# Patient Record
Sex: Male | Born: 1975
Health system: Southern US, Community
[De-identification: ages and names within clinical notes are randomized; demographics above are authoritative.]

## PROBLEM LIST (undated history)

## (undated) DIAGNOSIS — F32A Depression, unspecified: Secondary | ICD-10-CM

## (undated) DIAGNOSIS — F329 Major depressive disorder, single episode, unspecified: Secondary | ICD-10-CM

## (undated) DIAGNOSIS — R519 Headache, unspecified: Secondary | ICD-10-CM

## (undated) DIAGNOSIS — H409 Unspecified glaucoma: Secondary | ICD-10-CM

## (undated) DIAGNOSIS — K760 Fatty (change of) liver, not elsewhere classified: Secondary | ICD-10-CM

## (undated) DIAGNOSIS — K219 Gastro-esophageal reflux disease without esophagitis: Secondary | ICD-10-CM

## (undated) DIAGNOSIS — M1990 Unspecified osteoarthritis, unspecified site: Secondary | ICD-10-CM

## (undated) DIAGNOSIS — Z8489 Family history of other specified conditions: Secondary | ICD-10-CM

## (undated) DIAGNOSIS — F319 Bipolar disorder, unspecified: Secondary | ICD-10-CM

## (undated) DIAGNOSIS — I10 Essential (primary) hypertension: Secondary | ICD-10-CM

## (undated) DIAGNOSIS — J45909 Unspecified asthma, uncomplicated: Secondary | ICD-10-CM

## (undated) DIAGNOSIS — G4730 Sleep apnea, unspecified: Secondary | ICD-10-CM

## (undated) DIAGNOSIS — J449 Chronic obstructive pulmonary disease, unspecified: Secondary | ICD-10-CM

## (undated) DIAGNOSIS — R51 Headache: Secondary | ICD-10-CM

## (undated) DIAGNOSIS — E039 Hypothyroidism, unspecified: Secondary | ICD-10-CM

## (undated) DIAGNOSIS — F1020 Alcohol dependence, uncomplicated: Secondary | ICD-10-CM

## (undated) HISTORY — DX: Major depressive disorder, single episode, unspecified: F32.9

## (undated) HISTORY — DX: Unspecified glaucoma: H40.9

## (undated) HISTORY — PX: OTHER SURGICAL HISTORY: SHX169

## (undated) HISTORY — DX: Hypothyroidism, unspecified: E03.9

## (undated) HISTORY — PX: HERNIA REPAIR: SHX51

## (undated) HISTORY — DX: Bipolar disorder, unspecified: F31.9

## (undated) HISTORY — DX: Chronic obstructive pulmonary disease, unspecified: J44.9

## (undated) HISTORY — DX: Alcohol dependence, uncomplicated: F10.20

## (undated) HISTORY — DX: Unspecified asthma, uncomplicated: J45.909

## (undated) HISTORY — DX: Fatty (change of) liver, not elsewhere classified: K76.0

## (undated) HISTORY — DX: Essential (primary) hypertension: I10

## (undated) HISTORY — PX: VASECTOMY: SHX75

## (undated) HISTORY — DX: Sleep apnea, unspecified: G47.30

## (undated) HISTORY — DX: Unspecified osteoarthritis, unspecified site: M19.90

## (undated) HISTORY — DX: Depression, unspecified: F32.A

---

## 2004-10-18 HISTORY — PX: GALLBLADDER SURGERY: SHX652

## 2017-06-28 ENCOUNTER — Ambulatory Visit (INDEPENDENT_AMBULATORY_CARE_PROVIDER_SITE_OTHER): Payer: 59 | Admitting: Adult Health

## 2017-06-28 ENCOUNTER — Other Ambulatory Visit: Payer: Self-pay | Admitting: Adult Health

## 2017-06-28 ENCOUNTER — Encounter: Payer: Self-pay | Admitting: Adult Health

## 2017-06-28 VITALS — BP 140/88 | HR 104 | Temp 98.3°F | Resp 20 | Ht 69.0 in | Wt 267.0 lb

## 2017-06-28 DIAGNOSIS — I10 Essential (primary) hypertension: Secondary | ICD-10-CM

## 2017-06-28 DIAGNOSIS — F319 Bipolar disorder, unspecified: Secondary | ICD-10-CM | POA: Diagnosis not present

## 2017-06-28 DIAGNOSIS — E785 Hyperlipidemia, unspecified: Secondary | ICD-10-CM

## 2017-06-28 DIAGNOSIS — J449 Chronic obstructive pulmonary disease, unspecified: Secondary | ICD-10-CM | POA: Diagnosis not present

## 2017-06-28 DIAGNOSIS — Z760 Encounter for issue of repeat prescription: Secondary | ICD-10-CM | POA: Insufficient documentation

## 2017-06-28 DIAGNOSIS — H409 Unspecified glaucoma: Secondary | ICD-10-CM

## 2017-06-28 DIAGNOSIS — Z7689 Persons encountering health services in other specified circumstances: Secondary | ICD-10-CM

## 2017-06-28 DIAGNOSIS — Z23 Encounter for immunization: Secondary | ICD-10-CM | POA: Diagnosis not present

## 2017-06-28 MED ORDER — LISINOPRIL 10 MG PO TABS: 10.0000 mg | ORAL_TABLET | Freq: Every day | ORAL | 1 refills | Status: DC

## 2017-06-28 MED ORDER — COLESTIPOL HCL 1 G PO TABS: 1.0000 g | ORAL_TABLET | Freq: Two times a day (BID) | ORAL | 1 refills | Status: DC

## 2017-06-28 MED ORDER — PRAVASTATIN SODIUM 20 MG PO TABS: 20.0000 mg | ORAL_TABLET | Freq: Every day | ORAL | 1 refills | Status: DC

## 2017-06-28 MED ORDER — AMITRIPTYLINE HCL 50 MG PO TABS: 50.0000 mg | ORAL_TABLET | Freq: Every day | ORAL | 1 refills | Status: DC

## 2017-06-28 MED ORDER — RANITIDINE HCL 150 MG PO TABS: 150.0000 mg | ORAL_TABLET | Freq: Two times a day (BID) | ORAL | 1 refills | Status: DC

## 2017-06-28 MED ORDER — ALBUTEROL SULFATE HFA 108 (90 BASE) MCG/ACT IN AERS: 2.0000 | INHALATION_SPRAY | RESPIRATORY_TRACT | 3 refills | Status: DC | PRN

## 2017-06-28 MED ORDER — MONTELUKAST SODIUM 10 MG PO TABS: 10.0000 mg | ORAL_TABLET | Freq: Every day | ORAL | 1 refills | Status: DC

## 2017-06-28 MED ORDER — CETIRIZINE HCL 10 MG PO CHEW: 10.0000 mg | CHEWABLE_TABLET | Freq: Every day | ORAL | 1 refills | Status: DC

## 2017-06-28 MED ORDER — DIPHENHYDRAMINE HCL (SLEEP) 25 MG PO CAPS: 25.0000 mg | ORAL_CAPSULE | Freq: Every day | ORAL | 1 refills | Status: DC

## 2017-06-28 MED ORDER — MOMETASONE FUROATE 50 MCG/ACT NA SUSP: 2.0000 | Freq: Every day | NASAL | 6 refills | Status: DC

## 2017-06-28 MED ORDER — FLUTICASONE FUROATE-VILANTEROL 100-25 MCG/INH IN AEPB: 1.0000 | INHALATION_SPRAY | Freq: Every day | RESPIRATORY_TRACT | 3 refills | Status: DC

## 2017-06-28 MED FILL — LISINOPRIL 10 MG TABLET: 10 | 90 days supply | Qty: 90 | Fill #0

## 2017-06-28 MED FILL — BREO ELLIPTA 100-25 MCG INH: 100-25 | 30 days supply | Qty: 60 | Fill #0

## 2017-06-28 MED FILL — PRAVASTATIN SODIUM 20 MG TA: 20 | 90 days supply | Qty: 90 | Fill #0

## 2017-06-28 MED FILL — AMITRIPTYLINE HCL 50 MG TAB: 50 | 90 days supply | Qty: 90 | Fill #0

## 2017-06-28 MED FILL — VENTOLIN HFA 90 MCG INHALER: 108 (90 BAS | 17 days supply | Qty: 18 | Fill #0

## 2017-06-28 MED FILL — MOMETASONE FUROATE 50 MCG S: 50 | 30 days supply | Qty: 17 | Fill #0

## 2017-06-28 MED FILL — MONTELUKAST SOD 10 MG TAB: 10 | 90 days supply | Qty: 90 | Fill #0

## 2017-06-28 MED FILL — raNITIdine HCL 150 MG TABS: 150 | 90 days supply | Qty: 180 | Fill #0

## 2017-06-28 MED FILL — COLESTIPOL HCL 1 GM TABLET: 1 | 90 days supply | Qty: 180 | Fill #0

## 2017-06-28 NOTE — Progress Notes (Signed)
Patient presents to clinic today to establish care. He is a pleasant 41 year old male who  has a past medical history of Anemia; Arthritis; Asthma; Depression; Glaucoma; and Hypertension.   Recently moved to [place] from [place].   He had a physical in 2018   Acute Concerns: Establish Care   Chronic Issues: Glaucoma - Has been diagnosed 2-3 years ago. Needs referral to new eye doctor   Depression - Controlled with Latuda, lexapro and Ativan as needed. He feels as though his depression is well controlled  Sleep Apnea - he wears CPAP   COPD - He reports trying multiple inhalers in the past. He has been seen by pulmonary in the past, but his previous PCP was controlling in the past. He is currently using Albuteral and  Advair. He feels as though the Advair does not do much for him. He will walk up his apartment stairs and becomes short of breath  Hypertension - He takes lisinopril 10 mg   Hyperlipidemia - he takes pravastatin 20 mg  Health Maintenance: Dental -- Routine Dental  Vision -- Routine  Immunizations -- Needs flu vaccination  Colonoscopy -- Never had    Past Medical History:  Diagnosis Date  . Anemia   . Arthritis   . Asthma   . Depression   . Glaucoma   . Hypertension     Past Surgical History:  Procedure Laterality Date  . GALLBLADDER SURGERY  2006    No current outpatient prescriptions on file prior to visit.   No current facility-administered medications on file prior to visit.     Not on File  Family History  Problem Relation Age of Onset  . Arthritis Mother   . Alcohol abuse Father   . Arthritis Father   . Hyperlipidemia Father   . Hypertension Father   . Alcohol abuse Maternal Grandfather   . Alcohol abuse Paternal Grandfather   . Arthritis Paternal Grandfather   . Hyperlipidemia Paternal Grandfather   . Heart disease Paternal Grandfather   . Hypertension Paternal Grandfather   . Diabetes Other     Social History    Social History  . Marital status: Married    Spouse name: N/A  . Number of children: N/A  . Years of education: N/A   Occupational History  . Not on file.   Social History Main Topics  . Smoking status: Never Smoker  . Smokeless tobacco: Never Used  . Alcohol use No  . Drug use: No  . Sexual activity: Yes   Other Topics Concern  . Not on file   Social History Narrative  . No narrative on file    Review of Systems  Constitutional: Negative.   HENT: Negative.   Eyes: Negative.   Respiratory: Negative.   Cardiovascular: Negative.   Gastrointestinal: Negative.   Genitourinary: Negative.   Musculoskeletal: Negative.   Skin: Negative.   Neurological: Negative.   Endo/Heme/Allergies: Negative.   Psychiatric/Behavioral: Negative.   All other systems reviewed and are negative.   BP 140/88   Pulse (!) 104   Temp 98.3 F (36.8 C)   Resp 20   Ht 5\' 9"  (1.753 m)   Wt 267 lb (121.1 kg)   SpO2 97%   BMI 39.43 kg/m   Physical Exam  Constitutional: He is oriented to person, place, and time and well-developed, well-nourished, and in no distress. No distress.  Obese   HENT:  Head: Normocephalic and atraumatic.  Right Ear: External ear  normal.  Left Ear: External ear normal.  Nose: Nose normal.  Mouth/Throat: Oropharynx is clear and moist. No oropharyngeal exudate.  Eyes: Pupils are equal, round, and reactive to light. Conjunctivae and EOM are normal. Right eye exhibits no discharge. Left eye exhibits no discharge. No scleral icterus.  Neck: Normal range of motion. Neck supple. No JVD present. No tracheal deviation present. No thyromegaly present.  Cardiovascular: Normal rate, regular rhythm, normal heart sounds and intact distal pulses.  Exam reveals no gallop and no friction rub.   No murmur heard. Pulmonary/Chest: Effort normal and breath sounds normal. No stridor. No respiratory distress. He has no wheezes. He has no rales. He exhibits no tenderness.   Musculoskeletal: Normal range of motion. He exhibits edema (trace non pitting in left ankle ). He exhibits no tenderness or deformity.  Walks with a limp s/p ankle surgery   Lymphadenopathy:    He has no cervical adenopathy.  Neurological: He is alert and oriented to person, place, and time. He displays normal reflexes. No cranial nerve deficit. He exhibits normal muscle tone. Coordination normal. GCS score is 15.  Skin: Skin is warm and dry. No rash noted. He is not diaphoretic. No erythema. No pallor.  Psychiatric: Mood, memory, affect and judgment normal.  Nursing note and vitals reviewed.  Assessment/Plan: 1. Encounter to establish care - Follow up for CPE  - Will request notes from previous PCP  - Educated on the importance of diet and exercise   2. Essential hypertension - lisinopril (PRINIVIL,ZESTRIL) 10 MG tablet; Take 1 tablet (10 mg total) by mouth daily.  Dispense: 90 tablet; Refill: 1  3. Glaucoma of both eyes, unspecified glaucoma type - List of local opthalmology given   4. Hyperlipidemia, unspecified hyperlipidemia type  - colestipol (COLESTID) 1 g tablet; Take 1 tablet (1 g total) by mouth 2 (two) times daily.  Dispense: 90 tablet; Refill: 1 - pravastatin (PRAVACHOL) 20 MG tablet; Take 1 tablet (20 mg total) by mouth at bedtime.  Dispense: 90 tablet; Refill: 1  5. Medication refill  - fluticasone furoate-vilanterol (BREO ELLIPTA) 100-25 MCG/INH AEPB; Inhale 1 puff into the lungs daily.  Dispense: 28 each; Refill: 3 - albuterol (PROVENTIL HFA;VENTOLIN HFA) 108 (90 Base) MCG/ACT inhaler; Inhale 2 puffs into the lungs every 4 (four) hours as needed for wheezing or shortness of breath.  Dispense: 18 g; Refill: 3 - amitriptyline (ELAVIL) 50 MG tablet; Take 1 tablet (50 mg total) by mouth at bedtime.  Dispense: 90 tablet; Refill: 1 - cetirizine (ZYRTEC) 10 MG chewable tablet; Chew 1 tablet (10 mg total) by mouth daily.  Dispense: 90 tablet; Refill: 1 - colestipol  (COLESTID) 1 g tablet; Take 1 tablet (1 g total) by mouth 2 (two) times daily.  Dispense: 90 tablet; Refill: 1 - DiphenhydrAMINE HCl, Sleep, 25 MG CAPS; Take 25 mg by mouth at bedtime.  Dispense: 90 capsule; Refill: 1 - lisinopril (PRINIVIL,ZESTRIL) 10 MG tablet; Take 1 tablet (10 mg total) by mouth daily.  Dispense: 90 tablet; Refill: 1 - mometasone (NASONEX) 50 MCG/ACT nasal spray; Place 2 sprays into the nose daily.  Dispense: 17 g; Refill: 6 - montelukast (SINGULAIR) 10 MG tablet; Take 1 tablet (10 mg total) by mouth at bedtime.  Dispense: 90 tablet; Refill: 1 - pravastatin (PRAVACHOL) 20 MG tablet; Take 1 tablet (20 mg total) by mouth at bedtime.  Dispense: 90 tablet; Refill: 1 - ranitidine (ZANTAC) 150 MG tablet; Take 1 tablet (150 mg total) by mouth 2 (two) times daily.  Dispense: 90 tablet; Refill: 1  6. Chronic obstructive pulmonary disease, unspecified COPD type (HCC) - Will trial on Breo. D/c Advair.  - Consider referral to pulmonary  - fluticasone furoate-vilanterol (BREO ELLIPTA) 100-25 MCG/INH AEPB; Inhale 1 puff into the lungs daily.  Dispense: 28 each; Refill: 3 - albuterol (PROVENTIL HFA;VENTOLIN HFA) 108 (90 Base) MCG/ACT inhaler; Inhale 2 puffs into the lungs every 4 (four) hours as needed for wheezing or shortness of breath.  Dispense: 18 g; Refill: 3  7. Bipolar 1 disorder, depressed (HCC) - list of local psychiatrists given    Shirline Frees, NP

## 2017-06-28 NOTE — Telephone Encounter (Signed)
Pharmacy called to ask about pt's 2 scripts:  ranitidine (ZANTAC) 150 MG tablet  colestipol (COLESTID) 1 g tablet  These 2 were only done for 45 days. Thinks this may be an error. All the rest were done for 90 days.  Please call Lanora Manislizabeth @ [phone] She states these 2 can be edited and no need to send in new scripts.   [facility] - [place], [place] - [address]

## 2017-06-28 NOTE — Patient Instructions (Signed)
It was great meeting you today   Please follow up for your physical   I have sent in your medications    [facility] Address: [address], [place], [place] [address] Phone: [phone]  [facility] Address: [address], [place], [place] [address] Phone: [phone]

## 2017-06-28 NOTE — Addendum Note (Signed)
Addended by: Luella CookMCINTYRE, Longino Trefz E on: 06/28/2017 01:22 PM   Modules accepted: Orders

## 2017-06-29 MED ORDER — RANITIDINE HCL 150 MG PO TABS: 150.0000 mg | ORAL_TABLET | Freq: Two times a day (BID) | ORAL | 1 refills | Status: DC

## 2017-06-29 MED ORDER — COLESTIPOL HCL 1 G PO TABS: 1.0000 g | ORAL_TABLET | Freq: Two times a day (BID) | ORAL | 1 refills | Status: AC

## 2017-06-29 NOTE — Telephone Encounter (Signed)
Prescriptions re-sent for 90 day supply.

## 2017-07-08 ENCOUNTER — Encounter: Payer: Self-pay | Admitting: Adult Health

## 2017-07-22 ENCOUNTER — Encounter: Payer: Self-pay | Admitting: Adult Health

## 2017-08-09 MED FILL — BREO ELLIPTA 100-25 MCG INH: 100-25 | 30 days supply | Qty: 60 | Fill #1

## 2017-08-12 ENCOUNTER — Ambulatory Visit (INDEPENDENT_AMBULATORY_CARE_PROVIDER_SITE_OTHER): Payer: 59 | Admitting: Adult Health

## 2017-08-12 ENCOUNTER — Encounter: Payer: Self-pay | Admitting: Adult Health

## 2017-08-12 VITALS — BP 120/92 | Temp 97.7°F | Ht 69.0 in | Wt 270.0 lb

## 2017-08-12 DIAGNOSIS — K760 Fatty (change of) liver, not elsewhere classified: Secondary | ICD-10-CM | POA: Diagnosis not present

## 2017-08-12 DIAGNOSIS — E785 Hyperlipidemia, unspecified: Secondary | ICD-10-CM

## 2017-08-12 DIAGNOSIS — E039 Hypothyroidism, unspecified: Secondary | ICD-10-CM | POA: Diagnosis not present

## 2017-08-12 DIAGNOSIS — M25561 Pain in right knee: Secondary | ICD-10-CM

## 2017-08-12 DIAGNOSIS — M25562 Pain in left knee: Secondary | ICD-10-CM

## 2017-08-12 DIAGNOSIS — F319 Bipolar disorder, unspecified: Secondary | ICD-10-CM

## 2017-08-12 DIAGNOSIS — J449 Chronic obstructive pulmonary disease, unspecified: Secondary | ICD-10-CM

## 2017-08-12 DIAGNOSIS — G8929 Other chronic pain: Secondary | ICD-10-CM

## 2017-08-12 DIAGNOSIS — I10 Essential (primary) hypertension: Secondary | ICD-10-CM | POA: Diagnosis not present

## 2017-08-12 DIAGNOSIS — Z0000 Encounter for general adult medical examination without abnormal findings: Secondary | ICD-10-CM

## 2017-08-12 LAB — CBC WITH DIFFERENTIAL/PLATELET
BASOS ABS: 0.1 10*3/uL (ref 0.0–0.1)
BASOS PCT: 0.9 % (ref 0.0–3.0)
EOS ABS: 0.1 10*3/uL (ref 0.0–0.7)
Eosinophils Relative: 0.7 % (ref 0.0–5.0)
HCT: 45 % (ref 39.0–52.0)
Hemoglobin: 15.4 g/dL (ref 13.0–17.0)
LYMPHS ABS: 1.9 10*3/uL (ref 0.7–4.0)
LYMPHS PCT: 22.1 % (ref 12.0–46.0)
MCHC: 34.1 g/dL (ref 30.0–36.0)
MCV: 87.1 fl (ref 78.0–100.0)
MONO ABS: 0.6 10*3/uL (ref 0.1–1.0)
Monocytes Relative: 7.7 % (ref 3.0–12.0)
NEUTROS ABS: 5.8 10*3/uL (ref 1.4–7.7)
NEUTROS PCT: 68.6 % (ref 43.0–77.0)
PLATELETS: 278 10*3/uL (ref 150.0–400.0)
RBC: 5.16 Mil/uL (ref 4.22–5.81)
RDW: 13.3 % (ref 11.5–15.5)
WBC: 8.4 10*3/uL (ref 4.0–10.5)

## 2017-08-12 LAB — TSH: TSH: 3.26 u[IU]/mL (ref 0.35–4.50)

## 2017-08-12 LAB — LIPID PANEL
CHOL/HDL RATIO: 4
CHOLESTEROL: 206 mg/dL — AB (ref 0–200)
HDL: 51 mg/dL (ref >39.00)
LDL CALC: 125 mg/dL — AB (ref 0–99)
NonHDL: 155.07
TRIGLYCERIDES: 151 mg/dL — AB (ref 0.0–149.0)
VLDL: 30.2 mg/dL (ref 0.0–40.0)

## 2017-08-12 LAB — HEPATIC FUNCTION PANEL
ALK PHOS: 103 U/L (ref 39–117)
ALT: 104 U/L — ABNORMAL HIGH (ref 0–53)
AST: 58 U/L — ABNORMAL HIGH (ref 0–37)
Albumin: 4.6 g/dL (ref 3.5–5.2)
BILIRUBIN DIRECT: 0.1 mg/dL (ref 0.0–0.3)
BILIRUBIN TOTAL: 0.6 mg/dL (ref 0.2–1.2)
TOTAL PROTEIN: 7.4 g/dL (ref 6.0–8.3)

## 2017-08-12 LAB — BASIC METABOLIC PANEL
BUN: 9 mg/dL (ref 6–23)
CALCIUM: 9.7 mg/dL (ref 8.4–10.5)
CHLORIDE: 101 meq/L (ref 96–112)
CO2: 23 meq/L (ref 19–32)
CREATININE: 0.91 mg/dL (ref 0.40–1.50)
GFR: 97.54 mL/min (ref >60.00)
Glucose, Bld: 122 mg/dL — ABNORMAL HIGH (ref 70–99)
Potassium: 3.9 mEq/L (ref 3.5–5.1)
Sodium: 135 mEq/L (ref 135–145)

## 2017-08-12 LAB — T4, FREE: FREE T4: 0.69 ng/dL (ref 0.60–1.60)

## 2017-08-12 LAB — HEMOGLOBIN A1C: Hgb A1c MFr Bld: 6 % (ref 4.6–6.5)

## 2017-08-12 LAB — T3, FREE: T3, Free: 3.7 pg/mL (ref 2.3–4.2)

## 2017-08-12 MED ORDER — LURASIDONE HCL 80 MG PO TABS: 80.0000 mg | ORAL_TABLET | Freq: Every evening | ORAL | 0 refills | Status: DC

## 2017-08-12 MED ORDER — ESCITALOPRAM OXALATE 10 MG PO TABS: 15.0000 mg | ORAL_TABLET | ORAL | 0 refills | Status: DC

## 2017-08-12 MED FILL — ESCITALOPRAM 10 MG TABLET: 10 | 30 days supply | Qty: 45 | Fill #0

## 2017-08-12 NOTE — Patient Instructions (Signed)
It was great seeing you today   I will follow up with you regarding your blood work   Someone will contact you regarding your appointment with orthopedics.   Please let me know if you need anything   Health Maintenance, Male A healthy lifestyle and preventative care can promote health and wellness.  Maintain regular health, dental, and eye exams.  Eat a healthy diet. Foods like vegetables, fruits, whole grains, low-fat dairy products, and lean protein foods contain the nutrients you need and are low in calories. Decrease your intake of foods high in solid fats, added sugars, and salt. Get information about a proper diet from your health care provider, if necessary.  Regular physical exercise is one of the most important things you can do for your health. Most adults should get at least 150 minutes of moderate-intensity exercise (any activity that increases your heart rate and causes you to sweat) each week. In addition, most adults need muscle-strengthening exercises on 2 or more days a week.   Maintain a healthy weight. The body mass index (BMI) is a screening tool to identify possible weight problems. It provides an estimate of body fat based on height and weight. Your health care provider can find your BMI and can help you achieve or maintain a healthy weight. For males 20 years and older:  A BMI below 18.5 is considered underweight.  A BMI of 18.5 to 24.9 is normal.  A BMI of 25 to 29.9 is considered overweight.  A BMI of 30 and above is considered obese.  Maintain normal blood lipids and cholesterol by exercising and minimizing your intake of saturated fat. Eat a balanced diet with plenty of fruits and vegetables. Blood tests for lipids and cholesterol should begin at age 41 and be repeated every 5 years. If your lipid or cholesterol levels are high, you are over age 41, or you are at high risk for heart disease, you may need your cholesterol levels checked more frequently.Ongoing  high lipid and cholesterol levels should be treated with medicines if diet and exercise are not working.  If you smoke, find out from your health care provider how to quit. If you do not use tobacco, do not start.  Lung cancer screening is recommended for adults aged 55-80 years who are at high risk for developing lung cancer because of a history of smoking. A yearly low-dose CT scan of the lungs is recommended for people who have at least a 30-pack-year history of smoking and are current smokers or have quit within the past 15 years. A pack year of smoking is smoking an average of 1 pack of cigarettes a day for 1 year (for example, a 30-pack-year history of smoking could mean smoking 1 pack a day for 30 years or 2 packs a day for 15 years). Yearly screening should continue until the smoker has stopped smoking for at least 15 years. Yearly screening should be stopped for people who develop a health problem that would prevent them from having lung cancer treatment.  If you choose to drink alcohol, do not have more than 2 drinks per day. One drink is considered to be 12 oz (360 mL) of beer, 5 oz (150 mL) of wine, or 1.5 oz (45 mL) of liquor.  Avoid the use of street drugs. Do not share needles with anyone. Ask for help if you need support or instructions about stopping the use of drugs.  High blood pressure causes heart disease and increases the risk  of stroke. High blood pressure is more likely to develop in:  People who have blood pressure in the end of the normal range (100-139/85-89 mm Hg).  People who are overweight or obese.  People who are African American.  If you are 36-50 years of age, have your blood pressure checked every 3-5 years. If you are 55 years of age or older, have your blood pressure checked every year. You should have your blood pressure measured twice--once when you are at a hospital or clinic, and once when you are not at a hospital or clinic. Record the average of the two  measurements. To check your blood pressure when you are not at a hospital or clinic, you can use:  An automated blood pressure machine at a pharmacy.  A home blood pressure monitor.  If you are 77-12 years old, ask your health care provider if you should take aspirin to prevent heart disease.  Diabetes screening involves taking a blood sample to check your fasting blood sugar level. This should be done once every 3 years after age 66 if you are at a normal weight and without risk factors for diabetes. Testing should be considered at a younger age or be carried out more frequently if you are overweight and have at least 1 risk factor for diabetes.  Colorectal cancer can be detected and often prevented. Most routine colorectal cancer screening begins at the age of 64 and continues through age 32. However, your health care provider may recommend screening at an earlier age if you have risk factors for colon cancer. On a yearly basis, your health care provider may provide home test kits to check for hidden blood in the stool. A small camera at the end of a tube may be used to directly examine the colon (sigmoidoscopy or colonoscopy) to detect the earliest forms of colorectal cancer. Talk to your health care provider about this at age 15 when routine screening begins. A direct exam of the colon should be repeated every 5-10 years through age 8, unless early forms of precancerous polyps or small growths are found.  People who are at an increased risk for hepatitis B should be screened for this virus. You are considered at high risk for hepatitis B if:  You were born in a country where hepatitis B occurs often. Talk with your health care provider about which countries are considered high risk.  Your parents were born in a high-risk country and you have not received a shot to protect against hepatitis B (hepatitis B vaccine).  You have HIV or AIDS.  You use needles to inject street drugs.  You live  with, or have sex with, someone who has hepatitis B.  You are a man who has sex with other men (MSM).  You get hemodialysis treatment.  You take certain medicines for conditions like cancer, organ transplantation, and autoimmune conditions.  Hepatitis C blood testing is recommended for all people born from [birth year] through 1965 and any individual with known risk factors for hepatitis C.  Healthy men should no longer receive prostate-specific antigen (PSA) blood tests as part of routine cancer screening. Talk to your health care provider about prostate cancer screening.  Testicular cancer screening is not recommended for adolescents or adult males who have no symptoms. Screening includes self-exam, a health care provider exam, and other screening tests. Consult with your health care provider about any symptoms you have or any concerns you have about testicular cancer.  Practice safe sex.  Use condoms and avoid high-risk sexual practices to reduce the spread of sexually transmitted infections (STIs).  You should be screened for STIs, including gonorrhea and chlamydia if:  You are sexually active and are younger than 24 years.  You are older than 24 years, and your health care provider tells you that you are at risk for this type of infection.  Your sexual activity has changed since you were last screened, and you are at an increased risk for chlamydia or gonorrhea. Ask your health care provider if you are at risk.  If you are at risk of being infected with HIV, it is recommended that you take a prescription medicine daily to prevent HIV infection. This is called pre-exposure prophylaxis (PrEP). You are considered at risk if:  You are a man who has sex with other men (MSM).  You are a heterosexual man who is sexually active with multiple partners.  You take drugs by injection.  You are sexually active with a partner who has HIV.  Talk with your health care provider about whether you are at  high risk of being infected with HIV. If you choose to begin PrEP, you should first be tested for HIV. You should then be tested every 3 months for as long as you are taking PrEP.  Use sunscreen. Apply sunscreen liberally and repeatedly throughout the day. You should seek shade when your shadow is shorter than you. Protect yourself by wearing long sleeves, pants, a wide-brimmed hat, and sunglasses year round whenever you are outdoors.  Tell your health care provider of new moles or changes in moles, especially if there is a change in shape or color. Also, tell your health care provider if a mole is larger than the size of a pencil eraser.  A one-time screening for abdominal aortic aneurysm (AAA) and surgical repair of large AAAs by ultrasound is recommended for men aged 65-75 years who are current or former smokers.  Stay current with your vaccines (immunizations).   This information is not intended to replace advice given to you by your health care provider. Make sure you discuss any questions you have with your health care provider.   Document Released: 04/01/2008 Document Revised: 10/25/2014 Document Reviewed: 03/01/2011 Elsevier Interactive Patient Education [employer]

## 2017-08-12 NOTE — Progress Notes (Signed)
Subjective:    Patient ID: Colton PizzaJeffrey A Soderman Jr., male    DOB: 19-Jul-1976, 41 y.o.   MRN: [id number]  HPI  Patient presents for yearly preventative medicine examination. He is a pleasant 41 year old male who  has a past medical history of Alcoholism (HCC); Arthritis; Asthma; Bipolar 1 disorder (HCC); COPD (chronic obstructive pulmonary disease) (HCC); Depression; Glaucoma; Hypertension; Major depressive disorder; and Sleep apnea.  He is taking Latuda, Lexapro, and Ativan PRN for psychiatric issues. He reports that he has an appointment with psychiatry in one month. He needs refills of Lexapro and Latuda   He takes lisinopril 10 mg for hypertension.   He takes Colestid for hyperlipidemia   [facility]. He reports that " I haven't breathed this well in years."  Today in the clinic he mentions that he has been on synthroid in the past for hypothyroidism but has since run out of medication. He is not sure of his dose.   He also reports that prior to moving he was diagnosed with fatty liver disease and was set up to see a GI specialist.    All immunizations and health maintenance protocols were reviewed with the patient and needed orders were placed.  Appropriate screening laboratory values were ordered for the patient including screening of hyperlipidemia, renal function and hepatic function. If indicated by BPH, a PSA was ordered.  Medication reconciliation,  past medical history, social history, problem list and allergies were reviewed in detail with the patient  Goals were established with regard to weight loss, exercise, and  diet in compliance with medications  Review of Systems  Constitutional: Negative.   HENT: Negative.   Eyes: Negative.   Respiratory: Positive for shortness of breath.   Cardiovascular: Negative.   Gastrointestinal: Negative.   Endocrine: Negative.   Genitourinary: Negative.   Musculoskeletal: Positive for  arthralgias, back pain and gait problem.  Skin: Negative.   Allergic/Immunologic: Negative.   Hematological: Negative.   Psychiatric/Behavioral: Positive for behavioral problems and sleep disturbance. Negative for suicidal ideas. The patient is nervous/anxious.   All other systems reviewed and are negative.  Past Medical History:  Diagnosis Date  . Alcoholism (HCC)   . Arthritis   . Asthma   . Bipolar 1 disorder (HCC)   . COPD (chronic obstructive pulmonary disease) (HCC)   . Depression   . Fatty liver   . Glaucoma   . Hypertension   . Hypothyroidism   . Major depressive disorder   . Sleep apnea     Social History   Social History  . Marital status: Married    Spouse name: N/A  . Number of children: N/A  . Years of education: N/A   Occupational History  . Not on file.   Social History Main Topics  . Smoking status: Never Smoker  . Smokeless tobacco: Never Used  . Alcohol use No  . Drug use: No  . Sexual activity: Yes   Other Topics Concern  . Not on file   Social History Narrative   He is on disability    Two adopted children and one biological     Past Surgical History:  Procedure Laterality Date  . Cervical Disk replacement     . GALLBLADDER SURGERY  2006  . HERNIA REPAIR    . left ankle surgery     . VASECTOMY      Family History  Problem Relation Age of Onset  .  Arthritis Mother   . Alcohol abuse Father   . Arthritis Father   . Hyperlipidemia Father   . Hypertension Father   . Glaucoma Father   . Alcohol abuse Maternal Grandfather   . Alcohol abuse Paternal Grandfather   . Arthritis Paternal Grandfather   . Hyperlipidemia Paternal Grandfather   . Heart disease Paternal Grandfather   . Hypertension Paternal Grandfather   . Diabetes Other     Not on File  Current Outpatient Prescriptions on File Prior to Visit  Medication Sig Dispense Refill  . albuterol (PROVENTIL HFA;VENTOLIN HFA) 108 (90 Base) MCG/ACT inhaler Inhale 2 puffs into the  lungs every 4 (four) hours as needed for wheezing or shortness of breath. 18 g 3  . amitriptyline (ELAVIL) 50 MG tablet Take 1 tablet (50 mg total) by mouth at bedtime. 90 tablet 1  . cetirizine (ZYRTEC) 10 MG chewable tablet Chew 1 tablet (10 mg total) by mouth daily. 90 tablet 1  . colestipol (COLESTID) 1 g tablet Take 1 tablet (1 g total) by mouth 2 (two) times daily. 180 tablet 1  . DiphenhydrAMINE HCl, Sleep, 25 MG CAPS Take 25 mg by mouth at bedtime. 90 capsule 1  . fluticasone furoate-vilanterol (BREO ELLIPTA) 100-25 MCG/INH AEPB Inhale 1 puff into the lungs daily. 28 each 3  . lisinopril (PRINIVIL,ZESTRIL) 10 MG tablet Take 1 tablet (10 mg total) by mouth daily. 90 tablet 1  . LORazepam (ATIVAN) 1 MG tablet Take 1 mg by mouth every 4 (four) hours as needed for anxiety.    . mometasone (NASONEX) 50 MCG/ACT nasal spray Place 2 sprays into the nose daily. 17 g 6  . montelukast (SINGULAIR) 10 MG tablet Take 1 tablet (10 mg total) by mouth at bedtime. 90 tablet 1  . pravastatin (PRAVACHOL) 20 MG tablet Take 1 tablet (20 mg total) by mouth at bedtime. 90 tablet 1  . ranitidine (ZANTAC) 150 MG tablet Take 1 tablet (150 mg total) by mouth 2 (two) times daily. 180 tablet 1   No current facility-administered medications on file prior to visit.     BP (!) 120/92 (BP Location: Left Arm)   Temp 97.7 F (36.5 C) (Oral)   Ht 5\' 9"  (1.753 m)   Wt 270 lb (122.5 kg)   BMI 39.87 kg/m       Objective:   Physical Exam  Constitutional: He is oriented to person, place, and time. He appears well-developed and well-nourished. No distress.  HENT:  Head: Normocephalic and atraumatic.  Right Ear: External ear normal.  Left Ear: External ear normal.  Nose: Nose normal.  Mouth/Throat: Oropharynx is clear and moist. No oropharyngeal exudate.  Eyes: Pupils are equal, round, and reactive to light. Conjunctivae and EOM are normal. Right eye exhibits no discharge. Left eye exhibits no discharge. No scleral  icterus.  Neck: Neck supple. No JVD present. No tracheal deviation present. No thyromegaly present.  Cardiovascular: Normal rate, regular rhythm, normal heart sounds and intact distal pulses.  Exam reveals no gallop and no friction rub.   No murmur heard. Pulmonary/Chest: Effort normal and breath sounds normal. No stridor. No respiratory distress. He has no wheezes. He has no rales. He exhibits no tenderness.  Abdominal: Soft. Bowel sounds are normal. He exhibits no distension and no mass. There is no tenderness. There is no rebound and no guarding.  Genitourinary:  Genitourinary Comments: Refused    Musculoskeletal: Normal range of motion. He exhibits tenderness (bilateral knee pain ).  Walks with a  cain    Lymphadenopathy:    He has no cervical adenopathy.  Neurological: He is alert and oriented to person, place, and time. He has normal reflexes. He displays normal reflexes. No cranial nerve deficit. Coordination normal.  Skin: Skin is warm and dry. No rash noted. He is not diaphoretic. No erythema. No pallor.  Psychiatric: He has a normal mood and affect. Judgment and thought content normal.  Nursing note and vitals reviewed.     Assessment & Plan:  1. Routine general medical examination at a health care facility - Encouraged a heart healthy diet and frequent exercise  - Follow up in one year or sooner if needed - Basic metabolic panel - CBC with Differential/Platelet - Hepatic function panel - Hemoglobin A1c - Lipid panel - TSH - T4, Free - T3, free  2. Chronic pain of both knees - AMB referral to orthopedics  3. Essential hypertension - Well controlled.  - No change in medication at this time  - Basic metabolic panel - CBC with Differential/Platelet - Hepatic function panel - Hemoglobin A1c - Lipid panel  4. Chronic obstructive pulmonary disease, unspecified COPD type (HCC) - Better controlled with Breo   5. Bipolar 1 disorder, depressed (HCC)  - lurasidone  (LATUDA) 80 MG TABS tablet; Take 1 tablet (80 mg total) by mouth every evening.  Dispense: 30 tablet; Refill: 0 - escitalopram (LEXAPRO) 10 MG tablet; Take 1.5 tablets (15 mg total) by mouth every morning. TAKE 1 & 1/2 TABLETS EACH MORNING  Dispense: 45 tablet; Refill: 0  6. Hyperlipidemia, unspecified hyperlipidemia type - Continue with medication  - Encouraged heart healthy diet  - Basic metabolic panel - CBC with Differential/Platelet - Hepatic function panel - Hemoglobin A1c - Lipid panel - TSH  7. Hypothyroidism, unspecified type - Will find out what dose of Synthroid he takes when I call about labs   - TSH - T4, Free - T3, free  8. Fatty liver disease, nonalcoholic - Consider GI referral  - Hepatic function panel - Hemoglobin A1c - Lipid panel   Shirline Frees, NP

## 2017-08-16 ENCOUNTER — Ambulatory Visit (INDEPENDENT_AMBULATORY_CARE_PROVIDER_SITE_OTHER): Payer: 59 | Admitting: Orthopaedic Surgery

## 2017-08-16 ENCOUNTER — Encounter (INDEPENDENT_AMBULATORY_CARE_PROVIDER_SITE_OTHER): Payer: Self-pay | Admitting: Orthopaedic Surgery

## 2017-08-16 ENCOUNTER — Ambulatory Visit (INDEPENDENT_AMBULATORY_CARE_PROVIDER_SITE_OTHER): Payer: 59

## 2017-08-16 DIAGNOSIS — G8929 Other chronic pain: Secondary | ICD-10-CM

## 2017-08-16 DIAGNOSIS — M25561 Pain in right knee: Secondary | ICD-10-CM

## 2017-08-16 DIAGNOSIS — M25562 Pain in left knee: Secondary | ICD-10-CM | POA: Diagnosis not present

## 2017-08-16 MED ORDER — DICLOFENAC SODIUM 1 % TD GEL: 2.0000 g | Freq: Four times a day (QID) | TRANSDERMAL | 5 refills | Status: DC

## 2017-08-16 MED FILL — DICLOFENAC SODIUM 1% GEL: 1 | 13 days supply | Qty: 100 | Fill #0

## 2017-08-16 NOTE — Progress Notes (Signed)
Office Visit Note   Patient: Colton Vang.           Date of Birth: 12-20-1975           MRN: [id number] Visit Date: 08/16/2017              Requested by: Shirline Frees, NP [address] [place], [place] [address] PCP: Shirline Frees, NP   Assessment & Plan: Visit Diagnoses:  1. Chronic pain of both knees     Plan: Impression is bilateral knee arthritis.  Patient has failed conservative treatment.  Recommend MRI to rule out structural abnormalities.  Follow-up after the MRI.  Prescription for Voltaren gel.  Follow-Up Instructions: Return in about 2 weeks (around 08/30/2017).   Orders:  Orders Placed This Encounter  Procedures  . XR Knee 1-2 Views Right  . XR Knee 1-2 Views Left  . MR Knee Left w/o contrast  . MR Knee Right w/o contrast   Meds ordered this encounter  Medications  . diclofenac sodium (VOLTAREN) 1 % GEL    Sig: Apply 2 g topically 4 (four) times daily.    Dispense:  1 Tube    Refill:  5      Procedures: No procedures performed   Clinical Data: No additional findings.   Subjective: Chief Complaint  Patient presents with  . Right Knee - Pain  . Left Knee - Pain    Patient is a 41 year old gentleman who has had knee pain worse in the right and he ambulates with a cane due to this.  He has had Supartz injections in the past with initial relief but this no longer provides him with any relief.  The pain is worse with prolonged standing and walking and with climbing stairs.  Denies any numbness and tingling.  He is on disability for bipolar and has a past history of alcoholism.    Review of Systems  Constitutional: Negative.   All other systems reviewed and are negative.    Objective: Vital Signs: There were no vitals taken for this visit.  Physical Exam  Constitutional: He is oriented to person, place, and time. He appears well-developed and well-nourished.  HENT:  Head: Normocephalic and atraumatic.  Eyes: Pupils are equal,  round, and reactive to light.  Neck: Neck supple.  Pulmonary/Chest: Effort normal.  Abdominal: Soft.  Musculoskeletal: Normal range of motion.  Neurological: He is alert and oriented to person, place, and time.  Skin: Skin is warm.  Psychiatric: He has a normal mood and affect. His behavior is normal. Judgment and thought content normal.  Nursing note and vitals reviewed.   Ortho Exam Bilateral knee exam shows no joint effusion.  Collaterals and cruciates are stable.  Mild joint line tenderness.  Significant patellar crepitus.  Specialty Comments:  No specialty comments available.  Imaging: Xr Knee 1-2 Views Left  Result Date: 08/16/2017 Mild osteoarthritis  Xr Knee 1-2 Views Right  Result Date: 08/16/2017 Mild osteoarthritis    PMFS History: Patient Active Problem List   Diagnosis Date Noted  . Hypothyroidism 08/12/2017  . Fatty liver disease, nonalcoholic 08/12/2017  . Essential hypertension 06/28/2017  . Glaucoma of both eyes 06/28/2017  . Hyperlipidemia 06/28/2017  . Medication refill 06/28/2017  . Chronic obstructive pulmonary disease (HCC) 06/28/2017  . Bipolar 1 disorder, depressed (HCC) 06/28/2017   Past Medical History:  Diagnosis Date  . Alcoholism (HCC)   . Arthritis   . Asthma   . Bipolar 1 disorder (HCC)   .  COPD (chronic obstructive pulmonary disease) (HCC)   . Depression   . Fatty liver   . Glaucoma   . Hypertension   . Hypothyroidism   . Major depressive disorder   . Sleep apnea     Family History  Problem Relation Age of Onset  . Arthritis Mother   . Alcohol abuse Father   . Arthritis Father   . Hyperlipidemia Father   . Hypertension Father   . Glaucoma Father   . Alcohol abuse Maternal Grandfather   . Alcohol abuse Paternal Grandfather   . Arthritis Paternal Grandfather   . Hyperlipidemia Paternal Grandfather   . Heart disease Paternal Grandfather   . Hypertension Paternal Grandfather   . Diabetes Other     Past Surgical  History:  Procedure Laterality Date  . Cervical Disk replacement     . GALLBLADDER SURGERY  2006  . HERNIA REPAIR    . left ankle surgery     . VASECTOMY     Social History   Occupational History  . Not on file.   Social History Main Topics  . Smoking status: Never Smoker  . Smokeless tobacco: Never Used  . Alcohol use No  . Drug use: No  . Sexual activity: Yes
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
## 2017-08-30 ENCOUNTER — Encounter (HOSPITAL_COMMUNITY): Payer: Self-pay | Admitting: Psychiatry

## 2017-08-30 ENCOUNTER — Ambulatory Visit (INDEPENDENT_AMBULATORY_CARE_PROVIDER_SITE_OTHER): Payer: 59 | Admitting: Psychiatry

## 2017-08-30 VITALS — BP 132/90 | HR 126 | Ht 69.0 in | Wt 264.0 lb

## 2017-08-30 DIAGNOSIS — J45909 Unspecified asthma, uncomplicated: Secondary | ICD-10-CM | POA: Diagnosis not present

## 2017-08-30 DIAGNOSIS — Z811 Family history of alcohol abuse and dependence: Secondary | ICD-10-CM

## 2017-08-30 DIAGNOSIS — R4584 Anhedonia: Secondary | ICD-10-CM

## 2017-08-30 DIAGNOSIS — R5383 Other fatigue: Secondary | ICD-10-CM

## 2017-08-30 DIAGNOSIS — R4587 Impulsiveness: Secondary | ICD-10-CM | POA: Diagnosis not present

## 2017-08-30 DIAGNOSIS — I10 Essential (primary) hypertension: Secondary | ICD-10-CM

## 2017-08-30 DIAGNOSIS — F4310 Post-traumatic stress disorder, unspecified: Secondary | ICD-10-CM

## 2017-08-30 DIAGNOSIS — E039 Hypothyroidism, unspecified: Secondary | ICD-10-CM | POA: Diagnosis not present

## 2017-08-30 DIAGNOSIS — R4582 Worries: Secondary | ICD-10-CM

## 2017-08-30 DIAGNOSIS — F319 Bipolar disorder, unspecified: Secondary | ICD-10-CM

## 2017-08-30 DIAGNOSIS — J449 Chronic obstructive pulmonary disease, unspecified: Secondary | ICD-10-CM

## 2017-08-30 DIAGNOSIS — R454 Irritability and anger: Secondary | ICD-10-CM

## 2017-08-30 DIAGNOSIS — F0630 Mood disorder due to known physiological condition, unspecified: Secondary | ICD-10-CM | POA: Diagnosis not present

## 2017-08-30 DIAGNOSIS — F22 Delusional disorders: Secondary | ICD-10-CM

## 2017-08-30 MED ORDER — PRAZOSIN HCL 1 MG PO CAPS: ORAL_CAPSULE | ORAL | 0 refills | Status: DC

## 2017-08-30 MED ORDER — ESCITALOPRAM OXALATE 20 MG PO TABS: 20.0000 mg | ORAL_TABLET | ORAL | 0 refills | Status: DC

## 2017-08-30 MED FILL — PRAZOSIN 1 MG CAPSULE: 1 | 30 days supply | Qty: 60 | Fill #0

## 2017-08-30 MED FILL — ESCITALOPRAM 20 MG TABLET: 20 | 30 days supply | Qty: 30 | Fill #0

## 2017-08-30 NOTE — Patient Instructions (Addendum)
-   Start prazosin 1 mg for three nights, then 2 mg at night - Continue latuda 80 mg daily  - Increase lexapro 20 mg daily  - Continue amitriptyline 50 mg at night  - Continuie ativan 1 mg three times a day as needed for anxiety (120 tabs prescribed on 6/28) - [facility]

## 2017-09-06 ENCOUNTER — Ambulatory Visit
Admission: RE | Admit: 2017-09-06 | Discharge: 2017-09-06 | Disposition: A | Discharge status: Home / Self Care | Payer: 59 | Source: Ambulatory Visit | Attending: Orthopaedic Surgery | Admitting: Orthopaedic Surgery

## 2017-09-06 ENCOUNTER — Encounter (HOSPITAL_COMMUNITY): Payer: Self-pay | Admitting: Psychiatry

## 2017-09-06 ENCOUNTER — Ambulatory Visit (INDEPENDENT_AMBULATORY_CARE_PROVIDER_SITE_OTHER): Payer: 59 | Admitting: Psychiatry

## 2017-09-06 VITALS — BP 116/82 | HR 106 | Ht 69.0 in | Wt 268.0 lb

## 2017-09-06 DIAGNOSIS — G8929 Other chronic pain: Secondary | ICD-10-CM

## 2017-09-06 DIAGNOSIS — R600 Localized edema: Secondary | ICD-10-CM | POA: Diagnosis not present

## 2017-09-06 DIAGNOSIS — F515 Nightmare disorder: Secondary | ICD-10-CM | POA: Diagnosis not present

## 2017-09-06 DIAGNOSIS — F319 Bipolar disorder, unspecified: Secondary | ICD-10-CM

## 2017-09-06 DIAGNOSIS — G4700 Insomnia, unspecified: Secondary | ICD-10-CM | POA: Diagnosis not present

## 2017-09-06 DIAGNOSIS — M25461 Effusion, right knee: Secondary | ICD-10-CM | POA: Diagnosis not present

## 2017-09-06 DIAGNOSIS — Z811 Family history of alcohol abuse and dependence: Secondary | ICD-10-CM | POA: Diagnosis not present

## 2017-09-06 DIAGNOSIS — Z760 Encounter for issue of repeat prescription: Secondary | ICD-10-CM | POA: Diagnosis not present

## 2017-09-06 DIAGNOSIS — M25561 Pain in right knee: Principal | ICD-10-CM

## 2017-09-06 DIAGNOSIS — F419 Anxiety disorder, unspecified: Secondary | ICD-10-CM | POA: Diagnosis not present

## 2017-09-06 DIAGNOSIS — Z79899 Other long term (current) drug therapy: Secondary | ICD-10-CM

## 2017-09-06 DIAGNOSIS — F17290 Nicotine dependence, other tobacco product, uncomplicated: Secondary | ICD-10-CM | POA: Diagnosis not present

## 2017-09-06 DIAGNOSIS — F4312 Post-traumatic stress disorder, chronic: Secondary | ICD-10-CM

## 2017-09-06 DIAGNOSIS — M25562 Pain in left knee: Principal | ICD-10-CM

## 2017-09-06 MED ORDER — LURASIDONE HCL 80 MG PO TABS: 80.0000 mg | ORAL_TABLET | Freq: Every evening | ORAL | 1 refills | Status: DC

## 2017-09-06 MED ORDER — AMITRIPTYLINE HCL 100 MG PO TABS
100.0000 mg | ORAL_TABLET | Freq: Every day | ORAL | 1 refills | Status: DC
Start: 2017-09-06 — End: 2017-11-29

## 2017-09-06 MED ORDER — PRAZOSIN HCL 2 MG PO CAPS: 2.0000 mg | ORAL_CAPSULE | Freq: Every day | ORAL | 1 refills | Status: DC

## 2017-09-06 MED ORDER — AMITRIPTYLINE HCL 50 MG PO TABS: 50.0000 mg | ORAL_TABLET | Freq: Every day | ORAL | 1 refills | Status: DC

## 2017-09-06 MED ORDER — ESCITALOPRAM OXALATE 20 MG PO TABS: 20.0000 mg | ORAL_TABLET | ORAL | 1 refills | Status: DC

## 2017-09-06 MED FILL — BREO ELLIPTA 100-25 MCG INH: 100-25 | 30 days supply | Qty: 60 | Fill #2

## 2017-09-06 NOTE — Progress Notes (Signed)
Needs f/u appt 

## 2017-09-06 NOTE — Progress Notes (Signed)
BH MD/PA/NP OP Progress Note  09/06/2017 3:34 PM Colton PizzaJeffrey A Hathorne Jr.  MRN:  [id number]  Chief Complaint:  Chief Complaint    Other     HPI: Colton PizzaJeffrey A Treese Jr. presents for a transfer of care.  [facility] intake assessment.  I reviewed this note prior to his presentation.  He reports that his medications have remained generally stable for many years for bipolar disorder and chronic PTSD.  His primary concern is related to ongoing insomnia, due to anxiety and nightmares.  Prazosin has been helpful, and we agreed to increase Elavil to 100 mg to further target insomnia and chronic pain.  He denies any acute safety issues, drinks alcohol sparingly, and denies any substance use.  He has a good relationship with his wife, and is able to name goals including finding a new community of [employer]ason's to get involved with in the [place] area.  He has been involved in that community for many years and it is an important source of emotional support for him.  He enjoys spending time with his 2 dogs, reading, watching Netflix.  Visit Diagnosis:    ICD-10-CM   1. Chronic post-traumatic stress disorder (PTSD) F43.12 prazosin (MINIPRESS) 2 MG capsule  2. Bipolar 1 disorder, depressed (HCC) F31.9 lurasidone (LATUDA) 80 MG TABS tablet    escitalopram (LEXAPRO) 20 MG tablet  3. Medication refill Z76.0 amitriptyline (ELAVIL) 100 MG tablet    DISCONTINUED: amitriptyline (ELAVIL) 50 MG tablet    Past Psychiatric History: See intake H&P for full details. Reviewed, with no updates at this time.   Past Medical History:  Past Medical History:  Diagnosis Date  . Alcoholism (HCC)   . Arthritis   . Asthma   . Bipolar 1 disorder (HCC)   . COPD (chronic obstructive pulmonary disease) (HCC)   . Depression   . Fatty liver   . Glaucoma   . Hypertension   . Hypothyroidism   . Major depressive disorder   . Sleep apnea     Past Surgical History:  Procedure  Laterality Date  . Cervical Disk replacement     . GALLBLADDER SURGERY  2006  . HERNIA REPAIR    . left ankle surgery     . VASECTOMY      Family Psychiatric History: See intake H&P for full details. Reviewed, with no updates at this time.   Family History:  Family History  Problem Relation Age of Onset  . Arthritis Mother   . Alcohol abuse Father   . Arthritis Father   . Hyperlipidemia Father   . Hypertension Father   . Glaucoma Father   . Alcohol abuse Maternal Grandfather   . Alcohol abuse Paternal Grandfather   . Arthritis Paternal Grandfather   . Hyperlipidemia Paternal Grandfather   . Heart disease Paternal Grandfather   . Hypertension Paternal Grandfather   . Diabetes Other     Social History:  Social History   Socioeconomic History  . Marital status: Married    Spouse name: None  . Number of children: None  . Years of education: None  . Highest education level: None  Social Needs  . Financial resource strain: None  . Food insecurity - worry: None  . Food insecurity - inability: None  . Transportation needs - medical: None  . Transportation needs - non-medical: None  Occupational History  . None  Tobacco Use  . Smoking status: Current Every Day Smoker  Types: Cigars  . Smokeless tobacco: Never Used  . Tobacco comment: Smokes 3 a day  Substance and Sexual Activity  . Alcohol use: No  . Drug use: No  . Sexual activity: Yes    Partners: Female    Birth control/protection: None  Other Topics Concern  . None  Social History Narrative   He is on disability    Two adopted children and one biological     Allergies:  Allergies  Allergen Reactions  . Hydrocodone Itching    Metabolic Disorder Labs: Lab Results  Component Value Date   HGBA1C 6.0 08/12/2017   No results found for: PROLACTIN Lab Results  Component Value Date   CHOL 206 (H) 08/12/2017   TRIG 151.0 (H) 08/12/2017   HDL 51.00 08/12/2017   CHOLHDL 4 08/12/2017   VLDL 30.2  08/12/2017   LDLCALC 125 (H) 08/12/2017   Lab Results  Component Value Date   TSH 3.26 08/12/2017    Therapeutic Level Labs: No results found for: LITHIUM No results found for: VALPROATE No components found for:  CBMZ  Current Medications: Current Outpatient Medications  Medication Sig Dispense Refill  . albuterol (PROVENTIL HFA;VENTOLIN HFA) 108 (90 Base) MCG/ACT inhaler Inhale 2 puffs into the lungs every 4 (four) hours as needed for wheezing or shortness of breath. 18 g 3  . amitriptyline (ELAVIL) 100 MG tablet Take 1 tablet (100 mg total) by mouth at bedtime. 90 tablet 1  . cetirizine (ZYRTEC) 10 MG chewable tablet Chew 1 tablet (10 mg total) by mouth daily. 90 tablet 1  . colestipol (COLESTID) 1 g tablet Take 1 tablet (1 g total) by mouth 2 (two) times daily. 180 tablet 1  . diclofenac sodium (VOLTAREN) 1 % GEL Apply 2 g topically 4 (four) times daily. 1 Tube 5  . escitalopram (LEXAPRO) 20 MG tablet Take 1 tablet (20 mg total) by mouth every morning. 90 tablet 1  . fluticasone furoate-vilanterol (BREO ELLIPTA) 100-25 MCG/INH AEPB Inhale 1 puff into the lungs daily. 28 each 3  . lisinopril (PRINIVIL,ZESTRIL) 10 MG tablet Take 1 tablet (10 mg total) by mouth daily. 90 tablet 1  . LORazepam (ATIVAN) 1 MG tablet Take 1 mg by mouth every 4 (four) hours as needed for anxiety.    Marland Kitchen. lurasidone (LATUDA) 80 MG TABS tablet Take 1 tablet (80 mg total) by mouth every evening. 90 tablet 1  . mometasone (NASONEX) 50 MCG/ACT nasal spray Place 2 sprays into the nose daily. 17 g 6  . montelukast (SINGULAIR) 10 MG tablet Take 1 tablet (10 mg total) by mouth at bedtime. 90 tablet 1  . pravastatin (PRAVACHOL) 20 MG tablet Take 1 tablet (20 mg total) by mouth at bedtime. 90 tablet 1  . prazosin (MINIPRESS) 2 MG capsule Take 1 capsule (2 mg total) by mouth at bedtime. 90 capsule 1  . ranitidine (ZANTAC) 150 MG tablet Take 1 tablet (150 mg total) by mouth 2 (two) times daily. 180 tablet 1   No current  facility-administered medications for this visit.      Musculoskeletal: Strength & Muscle Tone: within normal limits Gait & Station: unsteady Patient leans: with cane  Psychiatric Specialty Exam: ROS  Blood pressure 116/82, pulse (!) 106, height 5\' 9"  (1.753 m), weight 268 lb (121.6 kg), SpO2 97 %.Body mass index is 39.58 kg/m.  General Appearance: Casual and Fairly Groomed  Eye Contact:  Fair  Speech:  Clear and Coherent  Volume:  Normal  Mood:  Euthymic  Affect:  Congruent and Flat  Thought Process:  Goal Directed and Descriptions of Associations: Intact  Orientation:  Full (Time, Place, and Person)  Thought Content: Logical   Suicidal Thoughts:  No  Homicidal Thoughts:  No  Memory:  Immediate;   Fair  Judgement:  Fair  Insight:  Fair  Psychomotor Activity:  Normal  Concentration:  Concentration: Good  Recall:  Good  Fund of Knowledge: Good  Language: Good  Akathisia:  Negative  Handed:  Left  AIMS (if indicated): not done  Assets:  Communication Skills Desire for Improvement Financial Resources/Insurance Housing Intimacy Social Support Transportation  ADL's:  Intact  Cognition: WNL  Sleep:  Fair   Screenings:   Assessment and Plan:  Colton Pizza. presents to establish psychiatric transfer of care, given that his provider in [place] is taking care of his wife.  I spent time today establishing rapport with the patient, and aligning ourselves on some goals, particularly related to helping improvement of sleep, and being able to address some issues energy which is complicated by chronic pain.  No acute safety issues or substance use and we will follow-up in 3 months.  1. Chronic post-traumatic stress disorder (PTSD)   2. Bipolar 1 disorder, depressed (HCC)   3. Medication refill     Status of current problems: unchanged  Labs Ordered: No orders of the defined types were placed in this encounter.   Labs Reviewed: n/a  Collateral  Obtained/Records Reviewed: n/a  Plan:  Continue Latuda 80 mg daily for bipolar depression; patient has been on this therapy for multiple years, and is stable Continue Lexapro 20 mg for PTSD Continue prazosin 2 mg at bedtime for nightmares Increase Elavil to 100 mg nightly for sleep Return to clinic in 10-12 weeks Patient has lorazepam tablets at home if needed for acute anxiety   I spent 25 minutes with the patient in direct face-to-face clinical care.  Greater than 50% of this time was spent in counseling and coordination of care with the patient.    Burnard Leigh, MD 09/06/2017, 3:34 PM

## 2017-09-13 ENCOUNTER — Encounter (INDEPENDENT_AMBULATORY_CARE_PROVIDER_SITE_OTHER): Payer: Self-pay | Admitting: Orthopaedic Surgery

## 2017-09-13 ENCOUNTER — Ambulatory Visit (INDEPENDENT_AMBULATORY_CARE_PROVIDER_SITE_OTHER): Payer: 59 | Admitting: Orthopaedic Surgery

## 2017-09-13 DIAGNOSIS — M1712 Unilateral primary osteoarthritis, left knee: Secondary | ICD-10-CM | POA: Diagnosis not present

## 2017-09-13 DIAGNOSIS — M7541 Impingement syndrome of right shoulder: Secondary | ICD-10-CM

## 2017-09-13 DIAGNOSIS — M1711 Unilateral primary osteoarthritis, right knee: Secondary | ICD-10-CM | POA: Diagnosis not present

## 2017-09-13 MED ORDER — LIDOCAINE HCL 1 % IJ SOLN
3.0000 mL | INTRAMUSCULAR | Status: AC | PRN
  Administered 2017-09-13: 3 mL

## 2017-09-13 MED ORDER — BUPIVACAINE HCL 0.5 % IJ SOLN
3.0000 mL | INTRAMUSCULAR | Status: AC | PRN
  Administered 2017-09-13: 3 mL via INTRA_ARTICULAR

## 2017-09-13 MED ORDER — METHYLPREDNISOLONE ACETATE 40 MG/ML IJ SUSP
40.0000 mg | INTRAMUSCULAR | Status: AC | PRN
  Administered 2017-09-13: 40 mg via INTRA_ARTICULAR

## 2017-09-13 NOTE — Progress Notes (Signed)
Office Visit Note   Patient: Colton PizzaJeffrey A Ask Jr.           Date of Birth: 07-30-1976           MRN: [id number] Visit Date: 09/13/2017              Requested by: Shirline FreesNafziger, Cory, NP [address] [place], [place] [id number] PCP: Shirline FreesNafziger, Cory, NP   Assessment & Plan: Visit Diagnoses:  1. Unilateral primary osteoarthritis, left knee   2. Unilateral primary osteoarthritis, right knee   3. Impingement syndrome of right shoulder     Plan: Right shoulder subacromial injection was performed today.  Patient has end-stage patellofemoral arthritis that has failed conservative treatment.  At this point we discussed patellofemoral replacement as an option to help control his pain although he understands that he may get incomplete relief of pain due to the fact that he does have some mild arthritis in his medial and lateral compartments.  Given the fact that he is only 41 years old I would like to stay away from a total knee replacement if possible and he agrees.  He understands risk benefits alternatives of surgery he understands and wished to proceed with a right patellofemoral arthroplasty first.  We will get him scheduled in the near future.  He denies a history of DVT.  Follow-Up Instructions: Return if symptoms worsen or fail to improve.   Orders:  No orders of the defined types were placed in this encounter.  No orders of the defined types were placed in this encounter.     Procedures: Large Joint Inj: R subacromial bursa on 09/13/2017 11:46 AM Indications: pain Details: 22 G needle  Arthrogram: No  Medications: 3 mL lidocaine 1 %; 3 mL bupivacaine 0.5 %; 40 mg methylPREDNISolone acetate 40 MG/ML Outcome: tolerated well, no immediate complications Consent was given by the patient. Patient was prepped and draped in the usual sterile fashion.       Clinical Data: No additional findings.   Subjective: Chief Complaint  Patient presents with  . Right Knee - Pain,  Follow-up  . Left Knee - Pain, Follow-up    Colton Vang follows up today for bilateral knee MRIs and new problem of right shoulder pain.  Denies any numbness and tingling.  He has had previous subacromial injections for bursitis.  He is requesting another one.    Review of Systems  Constitutional: Negative.   All other systems reviewed and are negative.    Objective: Vital Signs: There were no vitals taken for this visit.  Physical Exam  Constitutional: He is oriented to person, place, and time. He appears well-developed and well-nourished.  HENT:  Head: Normocephalic and atraumatic.  Eyes: Pupils are equal, round, and reactive to light.  Neck: Neck supple.  Pulmonary/Chest: Effort normal.  Abdominal: Soft.  Musculoskeletal: Normal range of motion.  Neurological: He is alert and oriented to person, place, and time.  Skin: Skin is warm.  Psychiatric: He has a normal mood and affect. His behavior is normal. Judgment and thought content normal.  Nursing note and vitals reviewed.   Ortho Exam Right shoulder exam shows positive impingement signs.  Rotator cuff testing is normal.  Bilateral knee exam is stable. Specialty Comments:  No specialty comments available.  Imaging: No results found.   PMFS History: Patient Active Problem List   Diagnosis Date Noted  . PTSD (post-traumatic stress disorder) 08/30/2017  . Mood disorder in conditions classified elsewhere 08/30/2017  . Hypothyroidism 08/12/2017  .  Fatty liver disease, nonalcoholic 08/12/2017  . Essential hypertension 06/28/2017  . Glaucoma of both eyes 06/28/2017  . Hyperlipidemia 06/28/2017  . Medication refill 06/28/2017  . Chronic obstructive pulmonary disease (HCC) 06/28/2017  . Bipolar 1 disorder, depressed (HCC) 06/28/2017   Past Medical History:  Diagnosis Date  . Alcoholism (HCC)   . Arthritis   . Asthma   . Bipolar 1 disorder (HCC)   . COPD (chronic obstructive pulmonary disease) (HCC)   .  Depression   . Fatty liver   . Glaucoma   . Hypertension   . Hypothyroidism   . Major depressive disorder   . Sleep apnea     Family History  Problem Relation Age of Onset  . Arthritis Mother   . Alcohol abuse Father   . Arthritis Father   . Hyperlipidemia Father   . Hypertension Father   . Glaucoma Father   . Alcohol abuse Maternal Grandfather   . Alcohol abuse Paternal Grandfather   . Arthritis Paternal Grandfather   . Hyperlipidemia Paternal Grandfather   . Heart disease Paternal Grandfather   . Hypertension Paternal Grandfather   . Diabetes Other     Past Surgical History:  Procedure Laterality Date  . Cervical Disk replacement     . GALLBLADDER SURGERY  2006  . HERNIA REPAIR    . left ankle surgery     . VASECTOMY     Social History   Occupational History  . Not on file  Tobacco Use  . Smoking status: Current Every Day Smoker    Types: Cigars  . Smokeless tobacco: Never Used  . Tobacco comment: Smokes 3 a day  Substance and Sexual Activity  . Alcohol use: No  . Drug use: No  . Sexual activity: Yes    Partners: Female    Birth control/protection: None

## 2017-09-15 MED FILL — AMITRIPTYLINE HCL 100 MG TA: 100 | 90 days supply | Qty: 90 | Fill #0

## 2017-09-15 MED FILL — PRAZOSIN 2 MG CAPSULE: 2 | 90 days supply | Qty: 90 | Fill #0

## 2017-09-16 ENCOUNTER — Telehealth (INDEPENDENT_AMBULATORY_CARE_PROVIDER_SITE_OTHER): Payer: Self-pay

## 2017-09-16 ENCOUNTER — Other Ambulatory Visit (INDEPENDENT_AMBULATORY_CARE_PROVIDER_SITE_OTHER): Payer: Self-pay | Admitting: Orthopaedic Surgery

## 2017-09-16 DIAGNOSIS — M1711 Unilateral primary osteoarthritis, right knee: Secondary | ICD-10-CM

## 2017-09-16 NOTE — Telephone Encounter (Signed)
Ok done

## 2017-09-16 NOTE — Telephone Encounter (Signed)
Please call wife, Consuella Loselaine.  She has some questions about the surgery.  [phone]

## 2017-09-20 ENCOUNTER — Encounter (HOSPITAL_COMMUNITY): Payer: Self-pay | Admitting: *Deleted

## 2017-09-20 ENCOUNTER — Other Ambulatory Visit: Payer: Self-pay

## 2017-09-20 MED ORDER — TRANEXAMIC ACID 1000 MG/10ML IV SOLN
1000.0000 mg | INTRAVENOUS | Status: AC
  Filled 2017-09-20: qty 10

## 2017-09-20 MED ORDER — DEXTROSE 5 % IV SOLN
3.0000 g | INTRAVENOUS | Status: AC
  Filled 2017-09-20: qty 3000

## 2017-09-20 NOTE — Progress Notes (Signed)
Pt denies SOB, chest pain, and being under the care of a cardiologist. Pt denies having an echo and cardiac cath but stated that a stress test was performed > 8 years ago. Pt denies having an EKG but stated that a chest x ray was performed at [facility] in [place], [place]; records requested. Pt to bring CPAP mask. Pt made to stop taking Aspirin, vitamins, fish oil, and herbal medications. Do not take any NSAIDs ie: Ibuprofen, Advil, Naproxen (Aleve), Motrin, BC and Goody Powder or any medication containing Aspirin. Pt verbalized understanding of all pre-op instructions.

## 2017-09-26 ENCOUNTER — Other Ambulatory Visit: Payer: Self-pay | Admitting: *Deleted

## 2017-09-26 NOTE — Pre-Procedure Instructions (Signed)
Colton PizzaJeffrey A Vanriper Jr.  09/26/2017      Colton Vang    Your procedure is scheduled on Wed. Dec. 12  Report [facility] Admitting at 11:45 A.M.  Call this number if you have problems the morning of surgery:  [phone]   Remember:  Do not eat food or drink liquids after midnight on Tues. Dec. 11   Take these medicines the morning of surgery with A SIP OF WATER : albuterol inhaler if needed-bring to hospital, cetirizine (zyrtec), escitalopram (lexapro),breo-bring to hospital, lorazepam (ativan), lurasidone (latuda), nasonex, ranitidine(zantac)             7 days prior to surgery STOP taking any Aspirin(unless otherwise instructed by your surgeon), Aleve, Naproxen, Ibuprofen, Motrin, Advil, Goody's, BC's, all herbal medications, fish oil, and all vitamins   Do not wear jewelry.  Do not wear lotions, powders, or perfumes, or deodorant.  Do not shave 48 hours prior to surgery.  Men may shave face and neck.  Do not bring Vang to the hospital.  Colton Vang.  Contacts, dentures or bridgework may not be worn into surgery.  Leave your suitcase in the car.  After surgery it may be brought to your room.  For patients admitted to the hospital, discharge time will be determined by your treatment team.  Patients discharged the day of surgery will not be allowed to drive home.    Special instructions:  Colton Vang- Preparing For Surgery  Before surgery, you can play an important role. Because skin is not sterile, your skin needs to be as free of germs as possible. You can reduce the number of germs on your skin by washing with CHG (chlorahexidine gluconate) Soap before surgery.  CHG is an antiseptic cleaner which kills germs and bonds with the skin to continue killing germs even  after washing.  Please do not use if you have an allergy to CHG or antibacterial soaps. If your skin becomes reddened/irritated stop using the CHG.  Do not shave (including legs and underarms) for at least 48 hours prior to first CHG shower. It is OK to shave your face.  Please follow these instructions carefully.   1. Shower the NIGHT BEFORE SURGERY and the MORNING OF SURGERY with CHG.   2. If you chose to wash your hair, wash your hair first as usual with your normal shampoo.  3. After you shampoo, rinse your hair and body thoroughly to remove the shampoo.  4. Use CHG as you would any other liquid soap. You can apply CHG directly to the skin and wash gently with a scrungie or a clean washcloth.   5. Apply the CHG Soap to your body ONLY FROM THE NECK DOWN.  Do not use on open wounds or open sores. Avoid contact with your eyes, ears, mouth and genitals (private parts). Wash Face and genitals (private parts)  with your normal soap.  6. Wash thoroughly, paying special attention to the area where your surgery will be performed.  7. Thoroughly rinse your body with warm water from the neck down.  8. DO NOT shower/wash with your normal soap after using and rinsing off the CHG Soap.  9. Pat yourself dry with a CLEAN TOWEL.  10. Wear CLEAN PAJAMAS to bed the night before surgery, wear comfortable clothes the  morning of surgery  11. Place CLEAN SHEETS on your bed the night of your first shower and DO NOT SLEEP WITH PETS.    Day of Surgery: Do not apply any deodorants/lotions. Please wear clean clothes to the hospital/surgery center.      Please read over the following fact sheets that you were given. Coughing and Deep Breathing and Surgical Site Infection Prevention

## 2017-09-26 NOTE — Patient Outreach (Addendum)
[facility] Care Management  09/26/2017  Colton PizzaJeffrey A Huq Jr. 01/07/1976 [id number]   Subjective:  Telephone call to patient's home number, spoke with patient, and HIPAA verified.  Discussed [facility] Care Management [facility] Transition of care follow up, preoperative call follow up, patient voiced understanding, and is in agreement to both types of follow up.  Patient states he is doing well, ready for [facility] 09[facility],  estimated length of stay is overnight, and will have wife/ children to assist as needed with activities of daily living / home management.   Patient voices understanding of medical diagnosis, pending surgery,  and treatment plan.  Patient gave [facility] verbal authorization to speak with wife Colton Vang(Colton Vang) regarding healthcare needs as needed.  Spoke with wife, discussed [facility] Care [facility]  Services, and she is also in agreement to follow up. Patient and wife states they are accessing the following [facility] benefits: outpatient pharmacy, hospital indemnity (wife verbally given contact number for [facility] [phone], will verify benefit, file claim if appropriate),  wife does not need family medical leave act (FMLA) since she works a weekend position and will have assistance from daughter as needed. Wife verbally given contact number for [facility]  [phone]) to access the Advanced Directives document completion benefit for [facility] employees/ spouse/ dependents.   Wife voices understanding and states she will follow up as appropriate.  Patient states he does not have any preoperative questions, care coordination, disease management, disease monitoring, transportation, community resource, or pharmacy needs at this time. States he is very appreciative of the follow up and is in agreement to receive [facility] Care Management information post transition of care follow up.     Objective: Per KPN (Knowledge Performance Now, point of care  tool) and chart review, patient will be [facility] on 09/28/17 for Right patellofemoral arthroplasty.   Patient has a history of Alcoholism, Arthritis; Asthma; Bipolar 1 disorder ; COPD (chronic obstructive pulmonary disease), Depression; Glaucoma; Hypertension; Major depressive disorder; and Sleep apnea.       Assessment: Received [facility] Preoperative / Transition of care referral on 09/22/17.  Preoperative call completed, and transition of care follow up pending notification of patient discharge.      Plan: RNCM will call patient for  telephone outreach attempt, transition of care follow up, within 3 business days of hospital discharge notification.      Fannye Myer H. Gardiner Barefootooper RN, BSN, CCM [facility] Care Management [facility] Telephonic CM Phone: [phone] Fax: [phone]

## 2017-09-27 ENCOUNTER — Other Ambulatory Visit: Payer: Self-pay

## 2017-09-27 ENCOUNTER — Encounter (HOSPITAL_COMMUNITY)
Admission: RE | Admit: 2017-09-27 | Discharge: 2017-09-27 | Disposition: A | Discharge status: Home / Self Care | Payer: 59 | Source: Ambulatory Visit | Attending: Orthopaedic Surgery | Admitting: Orthopaedic Surgery

## 2017-09-27 ENCOUNTER — Encounter (HOSPITAL_COMMUNITY): Payer: Self-pay

## 2017-09-27 ENCOUNTER — Telehealth (HOSPITAL_COMMUNITY): Payer: Self-pay | Admitting: *Deleted

## 2017-09-27 ENCOUNTER — Other Ambulatory Visit (INDEPENDENT_AMBULATORY_CARE_PROVIDER_SITE_OTHER): Payer: Self-pay | Admitting: Orthopaedic Surgery

## 2017-09-27 DIAGNOSIS — Z791 Long term (current) use of non-steroidal anti-inflammatories (NSAID): Secondary | ICD-10-CM | POA: Diagnosis not present

## 2017-09-27 DIAGNOSIS — Z83511 Family history of glaucoma: Secondary | ICD-10-CM | POA: Diagnosis not present

## 2017-09-27 DIAGNOSIS — Z833 Family history of diabetes mellitus: Secondary | ICD-10-CM | POA: Diagnosis not present

## 2017-09-27 DIAGNOSIS — I10 Essential (primary) hypertension: Secondary | ICD-10-CM | POA: Diagnosis not present

## 2017-09-27 DIAGNOSIS — Z811 Family history of alcohol abuse and dependence: Secondary | ICD-10-CM | POA: Diagnosis not present

## 2017-09-27 DIAGNOSIS — E039 Hypothyroidism, unspecified: Secondary | ICD-10-CM | POA: Diagnosis not present

## 2017-09-27 DIAGNOSIS — J449 Chronic obstructive pulmonary disease, unspecified: Secondary | ICD-10-CM | POA: Diagnosis not present

## 2017-09-27 DIAGNOSIS — F319 Bipolar disorder, unspecified: Secondary | ICD-10-CM | POA: Diagnosis not present

## 2017-09-27 DIAGNOSIS — Z9889 Other specified postprocedural states: Secondary | ICD-10-CM | POA: Diagnosis not present

## 2017-09-27 DIAGNOSIS — Z8349 Family history of other endocrine, nutritional and metabolic diseases: Secondary | ICD-10-CM | POA: Diagnosis not present

## 2017-09-27 DIAGNOSIS — Z760 Encounter for issue of repeat prescription: Secondary | ICD-10-CM

## 2017-09-27 DIAGNOSIS — M1711 Unilateral primary osteoarthritis, right knee: Secondary | ICD-10-CM | POA: Diagnosis not present

## 2017-09-27 DIAGNOSIS — H409 Unspecified glaucoma: Secondary | ICD-10-CM | POA: Diagnosis not present

## 2017-09-27 DIAGNOSIS — Z79899 Other long term (current) drug therapy: Secondary | ICD-10-CM | POA: Diagnosis not present

## 2017-09-27 DIAGNOSIS — Z8261 Family history of arthritis: Secondary | ICD-10-CM | POA: Diagnosis not present

## 2017-09-27 DIAGNOSIS — F17210 Nicotine dependence, cigarettes, uncomplicated: Secondary | ICD-10-CM | POA: Diagnosis not present

## 2017-09-27 DIAGNOSIS — Z9852 Vasectomy status: Secondary | ICD-10-CM | POA: Diagnosis not present

## 2017-09-27 DIAGNOSIS — G4730 Sleep apnea, unspecified: Secondary | ICD-10-CM | POA: Diagnosis not present

## 2017-09-27 DIAGNOSIS — Z7951 Long term (current) use of inhaled steroids: Secondary | ICD-10-CM | POA: Diagnosis not present

## 2017-09-27 DIAGNOSIS — K219 Gastro-esophageal reflux disease without esophagitis: Secondary | ICD-10-CM | POA: Diagnosis not present

## 2017-09-27 LAB — URINALYSIS, ROUTINE W REFLEX MICROSCOPIC
BILIRUBIN URINE: NEGATIVE
GLUCOSE, UA: NEGATIVE mg/dL
HGB URINE DIPSTICK: NEGATIVE
Ketones, ur: NEGATIVE mg/dL
Leukocytes, UA: NEGATIVE
Nitrite: NEGATIVE
Protein, ur: NEGATIVE mg/dL
SPECIFIC GRAVITY, URINE: 1.008 (ref 1.005–1.030)
pH: 6 (ref 5.0–8.0)

## 2017-09-27 LAB — CBC WITH DIFFERENTIAL/PLATELET
BASOS PCT: 0 %
Basophils Absolute: 0.1 10*3/uL (ref 0.0–0.1)
EOS ABS: 0.2 10*3/uL (ref 0.0–0.7)
Eosinophils Relative: 1 %
HCT: 45.9 % (ref 39.0–52.0)
Hemoglobin: 16.1 g/dL (ref 13.0–17.0)
Lymphocytes Relative: 22 %
Lymphs Abs: 2.6 10*3/uL (ref 0.7–4.0)
MCH: 30.3 pg (ref 26.0–34.0)
MCHC: 35.1 g/dL (ref 30.0–36.0)
MCV: 86.4 fL (ref 78.0–100.0)
MONO ABS: 1.3 10*3/uL — AB (ref 0.1–1.0)
MONOS PCT: 11 %
Neutro Abs: 7.9 10*3/uL — ABNORMAL HIGH (ref 1.7–7.7)
Neutrophils Relative %: 66 %
Platelets: 252 10*3/uL (ref 150–400)
RBC: 5.31 MIL/uL (ref 4.22–5.81)
RDW: 12.9 % (ref 11.5–15.5)
WBC: 11.9 10*3/uL — ABNORMAL HIGH (ref 4.0–10.5)

## 2017-09-27 LAB — COMPREHENSIVE METABOLIC PANEL
ALBUMIN: 4.3 g/dL (ref 3.5–5.0)
ALT: 86 U/L — ABNORMAL HIGH (ref 17–63)
ANION GAP: 12 (ref 5–15)
AST: 58 U/L — ABNORMAL HIGH (ref 15–41)
Alkaline Phosphatase: 110 U/L (ref 38–126)
BUN: 8 mg/dL (ref 6–20)
CO2: 21 mmol/L — AB (ref 22–32)
Calcium: 9.4 mg/dL (ref 8.9–10.3)
Chloride: 103 mmol/L (ref 101–111)
Creatinine, Ser: 1.16 mg/dL (ref 0.61–1.24)
GFR calc Af Amer: >60 mL/min (ref >60)
GFR calc non Af Amer: >60 mL/min (ref >60)
GLUCOSE: 90 mg/dL (ref 65–99)
POTASSIUM: 3.4 mmol/L — AB (ref 3.5–5.1)
SODIUM: 136 mmol/L (ref 135–145)
TOTAL PROTEIN: 7.6 g/dL (ref 6.5–8.1)
Total Bilirubin: 0.6 mg/dL (ref 0.3–1.2)

## 2017-09-27 LAB — APTT: aPTT: 30 seconds (ref 24–36)

## 2017-09-27 LAB — PROTIME-INR
INR: 0.92
Prothrombin Time: 12.3 seconds (ref 11.4–15.2)

## 2017-09-27 LAB — TYPE AND SCREEN
ABO/RH(D): O POS
ANTIBODY SCREEN: NEGATIVE

## 2017-09-27 LAB — SEDIMENTATION RATE: SED RATE: 12 mm/h (ref 0–16)

## 2017-09-27 LAB — SURGICAL PCR SCREEN
MRSA, PCR: NEGATIVE
Staphylococcus aureus: NEGATIVE

## 2017-09-27 LAB — C-REACTIVE PROTEIN: CRP: 1.6 mg/dL — AB (ref <1.0)

## 2017-09-27 LAB — ABO/RH: ABO/RH(D): O POS

## 2017-09-27 MED ORDER — POTASSIUM CHLORIDE 20 MEQ PO PACK: 40.0000 meq | PACK | Freq: Two times a day (BID) | ORAL | Status: AC

## 2017-09-27 MED FILL — ESCITALOPRAM 20 MG TABLET: 20 | 90 days supply | Qty: 90 | Fill #0

## 2017-09-27 NOTE — Progress Notes (Signed)
Anesthesia Chart Review: Patient is a 41 year old male scheduled for right patellofemoral arthroplasty on 09/28/17 by Dr. Gershon MusselNaiping Xu. PAT was on the afternoon of 09/27/17.   History includes smoking, glaucoma, hypertension, COPD, asthma, depression, bipolar 1 disorder, major depressive disorder, hypothyroidism, alcoholism (no current ETOH use documented), fatty liver, OSA (CPAP), headaches, GERD, DJD, cholecystectomy, hernia repair, cervical disc replacement. BMI is consistent with obesity.   PCP is Shirline Freesory Nafziger, NP.   Meds include albuterol, amitriptyline, Zyrtec, colestipol, Lexapro, Breo Ellipta, lisinopril, Ativan, Latuda, Nasonex, Singulair, KCl, pravastatin, Minipress, Zantac.  BP 124/88   Pulse 100   Temp 36.9 C   Resp 20   Ht 5\' 9"  (1.753 m)   Wt 267 lb (121.1 kg)   SpO2 99%   BMI 39.43 kg/m   EKG 09/27/17 (done after lab draw): ST at 113 bpm, non-specific T wave abnormality.  He reported a stress test > 10 years ago.   Preoperative labs noted. K 3,4, glucose 90. Cr 1.16. AST 58, ALT 86. WBC 11.9, H/H 16.1/45.9, PLT 252. PT/PTT, UA WNL. Thyroid studies WNL 08/12/17. LFTs appear stable when compared to 08/12/17 results.   Chart brought for review after patient left PAT due to EKG showing ST. HR just prior to seeing PAT RN was 100 bpm. PAT RN reported patient seemed uptight, but otherwise denied CV symptoms. Patient will get vitals on arrival tomorrow. If no significant tachycardia and remains asymptomatic from a CV standpoint then I would anticipate that he could proceed as planned. (He denied illicit drug use or current ETOH. TSH normal a few months ago.)  Velna Ochsllison Melchor Kirchgessner, PA-C [facility]/Anesthesiology Phone [phone] 09/27/2017 4:05 PM

## 2017-09-27 NOTE — Telephone Encounter (Signed)
PHONE CALL FROM PATIENT, SAID HE IS BEING SEEN AT THE [place] OFFICE.

## 2017-09-27 NOTE — Telephone Encounter (Signed)
Noted. Thanks for the update.  

## 2017-09-27 NOTE — Progress Notes (Addendum)
ZOX:[id number]:Nafziger, Kandee Keenory, NP  Cardiologist: pt denies  EKG: pt denies past year  Stress test: 10+ years ago  ECHO: pt denies ever  Cardiac Cath: pt denies ever  Chest x-ray: pt denies past year, no recent respiratory infection/complications

## 2017-09-28 ENCOUNTER — Observation Stay (HOSPITAL_COMMUNITY): Payer: 59

## 2017-09-28 ENCOUNTER — Inpatient Hospital Stay (HOSPITAL_COMMUNITY): Payer: 59 | Admitting: Anesthesiology

## 2017-09-28 ENCOUNTER — Encounter (HOSPITAL_COMMUNITY)
Admission: RE | Disposition: A | Discharge status: Home / Self Care | Payer: Self-pay | Source: Ambulatory Visit | Attending: Orthopaedic Surgery

## 2017-09-28 ENCOUNTER — Encounter (HOSPITAL_COMMUNITY): Payer: Self-pay | Admitting: General Practice

## 2017-09-28 ENCOUNTER — Observation Stay (HOSPITAL_COMMUNITY)
Admission: RE | Admit: 2017-09-28 | Discharge: 2017-09-29 | Disposition: A | Discharge status: Home / Self Care | Payer: 59 | Source: Ambulatory Visit | Attending: Orthopaedic Surgery | Admitting: Orthopaedic Surgery

## 2017-09-28 DIAGNOSIS — J449 Chronic obstructive pulmonary disease, unspecified: Secondary | ICD-10-CM | POA: Insufficient documentation

## 2017-09-28 DIAGNOSIS — Z79899 Other long term (current) drug therapy: Secondary | ICD-10-CM | POA: Insufficient documentation

## 2017-09-28 DIAGNOSIS — H409 Unspecified glaucoma: Secondary | ICD-10-CM | POA: Diagnosis not present

## 2017-09-28 DIAGNOSIS — K219 Gastro-esophageal reflux disease without esophagitis: Secondary | ICD-10-CM | POA: Insufficient documentation

## 2017-09-28 DIAGNOSIS — G4730 Sleep apnea, unspecified: Secondary | ICD-10-CM | POA: Insufficient documentation

## 2017-09-28 DIAGNOSIS — Z9889 Other specified postprocedural states: Secondary | ICD-10-CM | POA: Insufficient documentation

## 2017-09-28 DIAGNOSIS — F17210 Nicotine dependence, cigarettes, uncomplicated: Secondary | ICD-10-CM | POA: Insufficient documentation

## 2017-09-28 DIAGNOSIS — Z791 Long term (current) use of non-steroidal anti-inflammatories (NSAID): Secondary | ICD-10-CM | POA: Insufficient documentation

## 2017-09-28 DIAGNOSIS — M1711 Unilateral primary osteoarthritis, right knee: Secondary | ICD-10-CM | POA: Diagnosis not present

## 2017-09-28 DIAGNOSIS — F319 Bipolar disorder, unspecified: Secondary | ICD-10-CM | POA: Insufficient documentation

## 2017-09-28 DIAGNOSIS — Z811 Family history of alcohol abuse and dependence: Secondary | ICD-10-CM | POA: Insufficient documentation

## 2017-09-28 DIAGNOSIS — Z96651 Presence of right artificial knee joint: Secondary | ICD-10-CM

## 2017-09-28 DIAGNOSIS — R269 Unspecified abnormalities of gait and mobility: Secondary | ICD-10-CM | POA: Diagnosis not present

## 2017-09-28 DIAGNOSIS — Z7951 Long term (current) use of inhaled steroids: Secondary | ICD-10-CM | POA: Insufficient documentation

## 2017-09-28 DIAGNOSIS — Z833 Family history of diabetes mellitus: Secondary | ICD-10-CM | POA: Insufficient documentation

## 2017-09-28 DIAGNOSIS — E039 Hypothyroidism, unspecified: Secondary | ICD-10-CM | POA: Diagnosis not present

## 2017-09-28 DIAGNOSIS — Z471 Aftercare following joint replacement surgery: Secondary | ICD-10-CM | POA: Diagnosis not present

## 2017-09-28 DIAGNOSIS — G8918 Other acute postprocedural pain: Secondary | ICD-10-CM | POA: Diagnosis not present

## 2017-09-28 DIAGNOSIS — Z8261 Family history of arthritis: Secondary | ICD-10-CM | POA: Insufficient documentation

## 2017-09-28 DIAGNOSIS — Z83511 Family history of glaucoma: Secondary | ICD-10-CM | POA: Insufficient documentation

## 2017-09-28 DIAGNOSIS — Z8349 Family history of other endocrine, nutritional and metabolic diseases: Secondary | ICD-10-CM | POA: Insufficient documentation

## 2017-09-28 DIAGNOSIS — I10 Essential (primary) hypertension: Secondary | ICD-10-CM | POA: Insufficient documentation

## 2017-09-28 DIAGNOSIS — Z9852 Vasectomy status: Secondary | ICD-10-CM | POA: Insufficient documentation

## 2017-09-28 DIAGNOSIS — Z760 Encounter for issue of repeat prescription: Secondary | ICD-10-CM

## 2017-09-28 HISTORY — DX: Unspecified osteoarthritis, unspecified site: M19.90

## 2017-09-28 HISTORY — PX: TOTAL KNEE ARTHROPLASTY: SHX125

## 2017-09-28 HISTORY — DX: Headache: R51

## 2017-09-28 HISTORY — DX: Gastro-esophageal reflux disease without esophagitis: K21.9

## 2017-09-28 HISTORY — DX: Family history of other specified conditions: Z84.89

## 2017-09-28 HISTORY — DX: Headache, unspecified: R51.9

## 2017-09-28 SURGERY — ARTHROPLASTY, KNEE, TOTAL
Anesthesia: Spinal | Site: Knee | Laterality: Right

## 2017-09-28 MED ORDER — PROMETHAZINE HCL 25 MG PO TABS: 25.0000 mg | ORAL_TABLET | Freq: Four times a day (QID) | ORAL | 1 refills | Status: DC | PRN

## 2017-09-28 MED ORDER — METHOCARBAMOL 1000 MG/10ML IJ SOLN
500.0000 mg | Freq: Four times a day (QID) | INTRAVENOUS | Status: DC | PRN
  Filled 2017-09-28: qty 5

## 2017-09-28 MED ORDER — LACTATED RINGERS IV SOLN
INTRAVENOUS | Status: DC
  Administered 2017-09-28 (×2): via INTRAVENOUS

## 2017-09-28 MED ORDER — OXYCODONE HCL 5 MG/5ML PO SOLN: 5.0000 mg | Freq: Once | ORAL | Status: DC | PRN

## 2017-09-28 MED ORDER — PROPOFOL 10 MG/ML IV BOLUS
INTRAVENOUS | Status: AC
  Filled 2017-09-28: qty 20

## 2017-09-28 MED ORDER — MORPHINE SULFATE (PF) 4 MG/ML IV SOLN
1.0000 mg | INTRAVENOUS | Status: DC | PRN
  Administered 2017-09-28 – 2017-09-29 (×3): 1 mg via INTRAVENOUS
  Filled 2017-09-28 (×4): qty 1

## 2017-09-28 MED ORDER — MAGNESIUM CITRATE PO SOLN: 1.0000 | Freq: Once | ORAL | Status: DC | PRN

## 2017-09-28 MED ORDER — OXYCODONE HCL 5 MG PO TABS
10.0000 mg | ORAL_TABLET | ORAL | Status: DC | PRN
  Administered 2017-09-29: 10 mg via ORAL
  Filled 2017-09-28 (×2): qty 2

## 2017-09-28 MED ORDER — POTASSIUM CHLORIDE 20 MEQ PO PACK
40.0000 meq | PACK | Freq: Two times a day (BID) | ORAL | Status: DC
  Administered 2017-09-28 – 2017-09-29 (×2): 40 meq via ORAL
  Filled 2017-09-28 (×2): qty 2

## 2017-09-28 MED ORDER — SODIUM CHLORIDE 0.9 % IV SOLN
INTRAVENOUS | Status: DC
  Administered 2017-09-28 – 2017-09-29 (×2): via INTRAVENOUS

## 2017-09-28 MED ORDER — 0.9 % SODIUM CHLORIDE (POUR BTL) OPTIME
TOPICAL | Status: DC | PRN
  Administered 2017-09-28: 1000 mL

## 2017-09-28 MED ORDER — METHOCARBAMOL 500 MG PO TABS
500.0000 mg | ORAL_TABLET | Freq: Four times a day (QID) | ORAL | Status: DC | PRN
  Administered 2017-09-28 – 2017-09-29 (×2): 500 mg via ORAL
  Filled 2017-09-28 (×2): qty 1

## 2017-09-28 MED ORDER — FENTANYL CITRATE (PF) 250 MCG/5ML IJ SOLN
INTRAMUSCULAR | Status: AC
  Filled 2017-09-28: qty 5

## 2017-09-28 MED ORDER — PRAVASTATIN SODIUM 10 MG PO TABS
20.0000 mg | ORAL_TABLET | Freq: Every day | ORAL | Status: DC
  Administered 2017-09-28: 20 mg via ORAL
  Filled 2017-09-28: qty 2

## 2017-09-28 MED ORDER — MIDAZOLAM HCL 2 MG/2ML IJ SOLN
2.0000 mg | Freq: Once | INTRAMUSCULAR | Status: AC
  Administered 2017-09-28: 2 mg via INTRAVENOUS

## 2017-09-28 MED ORDER — OXYCODONE HCL ER 10 MG PO T12A: 10.0000 mg | EXTENDED_RELEASE_TABLET | Freq: Two times a day (BID) | ORAL | 0 refills | Status: DC

## 2017-09-28 MED ORDER — CHLORHEXIDINE GLUCONATE 4 % EX LIQD: 60.0000 mL | Freq: Once | CUTANEOUS | Status: DC

## 2017-09-28 MED ORDER — TRANEXAMIC ACID 1000 MG/10ML IV SOLN
2000.0000 mg | Freq: Once | INTRAVENOUS | Status: DC
  Filled 2017-09-28: qty 20

## 2017-09-28 MED ORDER — CEFAZOLIN SODIUM-DEXTROSE 2-3 GM-%(50ML) IV SOLR
INTRAVENOUS | Status: DC | PRN
  Administered 2017-09-28: 3 g via INTRAVENOUS

## 2017-09-28 MED ORDER — TRANEXAMIC ACID 1000 MG/10ML IV SOLN
INTRAVENOUS | Status: AC | PRN
  Administered 2017-09-28: 2000 mg via TOPICAL

## 2017-09-28 MED ORDER — PROPOFOL 500 MG/50ML IV EMUL
INTRAVENOUS | Status: DC | PRN
  Administered 2017-09-28: 100 ug/kg/min via INTRAVENOUS

## 2017-09-28 MED ORDER — ASPIRIN EC 325 MG PO TBEC
325.0000 mg | DELAYED_RELEASE_TABLET | Freq: Two times a day (BID) | ORAL | Status: DC
  Administered 2017-09-28 – 2017-09-29 (×2): 325 mg via ORAL
  Filled 2017-09-28 (×2): qty 1

## 2017-09-28 MED ORDER — HYDROMORPHONE HCL 1 MG/ML IJ SOLN
INTRAMUSCULAR | Status: AC
  Filled 2017-09-28: qty 1

## 2017-09-28 MED ORDER — SODIUM CHLORIDE 0.9 % IV SOLN
1000.0000 mg | INTRAVENOUS | Status: AC
  Administered 2017-09-28: 1000 mg via INTRAVENOUS
  Filled 2017-09-28: qty 1100

## 2017-09-28 MED ORDER — ROPIVACAINE HCL 5 MG/ML IJ SOLN
INTRAMUSCULAR | Status: DC | PRN
  Administered 2017-09-28: 20 mL via PERINEURAL

## 2017-09-28 MED ORDER — MENTHOL 3 MG MT LOZG: 1.0000 | LOZENGE | OROMUCOSAL | Status: DC | PRN

## 2017-09-28 MED ORDER — ASPIRIN EC 325 MG PO TBEC: 325.0000 mg | DELAYED_RELEASE_TABLET | Freq: Two times a day (BID) | ORAL | 0 refills | Status: DC

## 2017-09-28 MED ORDER — DICLOFENAC SODIUM 1 % TD GEL
2.0000 g | Freq: Four times a day (QID) | TRANSDERMAL | Status: DC
  Filled 2017-09-28: qty 100

## 2017-09-28 MED ORDER — ONDANSETRON HCL 4 MG/2ML IJ SOLN
INTRAMUSCULAR | Status: AC
  Filled 2017-09-28: qty 2

## 2017-09-28 MED ORDER — SODIUM CHLORIDE 0.9 % IV SOLN
1000.0000 mg | Freq: Once | INTRAVENOUS | Status: AC
  Administered 2017-09-28: 1000 mg via INTRAVENOUS
  Filled 2017-09-28: qty 10

## 2017-09-28 MED ORDER — KETOROLAC TROMETHAMINE 15 MG/ML IJ SOLN
30.0000 mg | Freq: Four times a day (QID) | INTRAMUSCULAR | Status: DC
  Administered 2017-09-28 – 2017-09-29 (×3): 30 mg via INTRAVENOUS
  Filled 2017-09-28 (×3): qty 2

## 2017-09-28 MED ORDER — PHENOL 1.4 % MT LIQD: 1.0000 | OROMUCOSAL | Status: DC | PRN

## 2017-09-28 MED ORDER — MIDAZOLAM HCL 2 MG/2ML IJ SOLN
INTRAMUSCULAR | Status: AC
  Administered 2017-09-28: 2 mg via INTRAVENOUS
  Filled 2017-09-28: qty 2

## 2017-09-28 MED ORDER — OXYCODONE HCL 5 MG PO TABS: 5.0000 mg | ORAL_TABLET | ORAL | Status: DC | PRN

## 2017-09-28 MED ORDER — SODIUM CHLORIDE 0.9% FLUSH
INTRAVENOUS | Status: DC | PRN
  Administered 2017-09-28: 10 mL

## 2017-09-28 MED ORDER — FLUTICASONE FUROATE-VILANTEROL 100-25 MCG/INH IN AEPB
1.0000 | INHALATION_SPRAY | Freq: Every day | RESPIRATORY_TRACT | Status: DC
  Administered 2017-09-29: 10:00:00 1 via RESPIRATORY_TRACT
  Filled 2017-09-28: qty 28

## 2017-09-28 MED ORDER — OXYCODONE HCL ER 15 MG PO T12A
15.0000 mg | EXTENDED_RELEASE_TABLET | Freq: Two times a day (BID) | ORAL | Status: DC
Start: 2017-09-28 — End: 2017-09-29
  Administered 2017-09-28 – 2017-09-29 (×2): 15 mg via ORAL
  Filled 2017-09-28 (×2): qty 1

## 2017-09-28 MED ORDER — CEFAZOLIN SODIUM-DEXTROSE 2-4 GM/100ML-% IV SOLN
2.0000 g | Freq: Four times a day (QID) | INTRAVENOUS | Status: AC
  Administered 2017-09-28 – 2017-09-29 (×3): 2 g via INTRAVENOUS
  Filled 2017-09-28 (×3): qty 100

## 2017-09-28 MED ORDER — TIZANIDINE HCL 4 MG PO TABS: 4.0000 mg | ORAL_TABLET | Freq: Four times a day (QID) | ORAL | 2 refills | Status: DC | PRN

## 2017-09-28 MED ORDER — LANSOPRAZOLE 15 MG PO CPDR
30.0000 mg | DELAYED_RELEASE_CAPSULE | Freq: Two times a day (BID) | ORAL | Status: DC | PRN
  Filled 2017-09-28: qty 2

## 2017-09-28 MED ORDER — PRAZOSIN HCL 2 MG PO CAPS
2.0000 mg | ORAL_CAPSULE | Freq: Every day | ORAL | Status: DC
  Filled 2017-09-28: qty 1

## 2017-09-28 MED ORDER — METOCLOPRAMIDE HCL 5 MG PO TABS: 5.0000 mg | ORAL_TABLET | Freq: Three times a day (TID) | ORAL | Status: DC | PRN

## 2017-09-28 MED ORDER — LURASIDONE HCL 40 MG PO TABS
80.0000 mg | ORAL_TABLET | Freq: Every evening | ORAL | Status: DC
  Administered 2017-09-28: 80 mg via ORAL
  Filled 2017-09-28: qty 2

## 2017-09-28 MED ORDER — ESCITALOPRAM OXALATE 20 MG PO TABS
20.0000 mg | ORAL_TABLET | Freq: Every day | ORAL | Status: DC
  Administered 2017-09-29: 20 mg via ORAL
  Filled 2017-09-28: qty 1

## 2017-09-28 MED ORDER — LISINOPRIL 10 MG PO TABS
10.0000 mg | ORAL_TABLET | Freq: Every day | ORAL | Status: DC
  Administered 2017-09-28 – 2017-09-29 (×2): 10 mg via ORAL
  Filled 2017-09-28 (×2): qty 1

## 2017-09-28 MED ORDER — ONDANSETRON HCL 4 MG/2ML IJ SOLN
4.0000 mg | Freq: Four times a day (QID) | INTRAMUSCULAR | Status: DC | PRN
  Administered 2017-09-28: 4 mg via INTRAVENOUS
  Filled 2017-09-28: qty 2

## 2017-09-28 MED ORDER — ONDANSETRON HCL 4 MG PO TABS: 4.0000 mg | ORAL_TABLET | Freq: Three times a day (TID) | ORAL | 0 refills | Status: DC | PRN

## 2017-09-28 MED ORDER — ACETAMINOPHEN 500 MG PO TABS
1000.0000 mg | ORAL_TABLET | Freq: Four times a day (QID) | ORAL | Status: DC
  Administered 2017-09-28 – 2017-09-29 (×3): 1000 mg via ORAL
  Filled 2017-09-28 (×3): qty 2

## 2017-09-28 MED ORDER — PROMETHAZINE HCL 25 MG/ML IJ SOLN: 6.2500 mg | INTRAMUSCULAR | Status: DC | PRN

## 2017-09-28 MED ORDER — ACETAMINOPHEN 650 MG RE SUPP: 650.0000 mg | RECTAL | Status: DC | PRN

## 2017-09-28 MED ORDER — MIDAZOLAM HCL 2 MG/2ML IJ SOLN
INTRAMUSCULAR | Status: DC | PRN
  Administered 2017-09-28 (×2): 1 mg via INTRAVENOUS

## 2017-09-28 MED ORDER — HYDROMORPHONE HCL 1 MG/ML IJ SOLN
0.2500 mg | INTRAMUSCULAR | Status: DC | PRN
  Administered 2017-09-28 (×4): 0.5 mg via INTRAVENOUS

## 2017-09-28 MED ORDER — FENTANYL CITRATE (PF) 100 MCG/2ML IJ SOLN
INTRAMUSCULAR | Status: AC
  Administered 2017-09-28: 100 ug via INTRAVENOUS
  Filled 2017-09-28: qty 2

## 2017-09-28 MED ORDER — ACETAMINOPHEN 325 MG PO TABS: 650.0000 mg | ORAL_TABLET | ORAL | Status: DC | PRN

## 2017-09-28 MED ORDER — MONTELUKAST SODIUM 10 MG PO TABS
10.0000 mg | ORAL_TABLET | Freq: Every day | ORAL | Status: DC
  Administered 2017-09-28: 10 mg via ORAL
  Filled 2017-09-28: qty 1

## 2017-09-28 MED ORDER — AMITRIPTYLINE HCL 50 MG PO TABS
100.0000 mg | ORAL_TABLET | Freq: Every day | ORAL | Status: DC
  Administered 2017-09-28: 100 mg via ORAL
  Filled 2017-09-28: qty 2

## 2017-09-28 MED ORDER — DEXAMETHASONE SODIUM PHOSPHATE 10 MG/ML IJ SOLN
10.0000 mg | Freq: Once | INTRAMUSCULAR | Status: AC
  Administered 2017-09-29: 10 mg via INTRAVENOUS
  Filled 2017-09-28: qty 1

## 2017-09-28 MED ORDER — METOCLOPRAMIDE HCL 5 MG/ML IJ SOLN: 5.0000 mg | Freq: Three times a day (TID) | INTRAMUSCULAR | Status: DC | PRN

## 2017-09-28 MED ORDER — BUPIVACAINE IN DEXTROSE 0.75-8.25 % IT SOLN
INTRATHECAL | Status: DC | PRN
  Administered 2017-09-28: 2 mL via INTRATHECAL

## 2017-09-28 MED ORDER — MIDAZOLAM HCL 2 MG/2ML IJ SOLN
INTRAMUSCULAR | Status: AC
  Filled 2017-09-28: qty 2

## 2017-09-28 MED ORDER — ALBUTEROL SULFATE (2.5 MG/3ML) 0.083% IN NEBU: 3.0000 mL | INHALATION_SOLUTION | RESPIRATORY_TRACT | Status: DC | PRN

## 2017-09-28 MED ORDER — VANCOMYCIN HCL 1000 MG IV SOLR
INTRAVENOUS | Status: DC | PRN
  Administered 2017-09-28: 1000 mg via TOPICAL

## 2017-09-28 MED ORDER — OXYCODONE HCL 5 MG PO TABS: 5.0000 mg | ORAL_TABLET | Freq: Once | ORAL | Status: DC | PRN

## 2017-09-28 MED ORDER — COLESTIPOL HCL 1 G PO TABS
1.0000 g | ORAL_TABLET | Freq: Two times a day (BID) | ORAL | Status: DC
  Administered 2017-09-28 – 2017-09-29 (×2): 1 g via ORAL
  Filled 2017-09-28 (×2): qty 1

## 2017-09-28 MED ORDER — FENTANYL CITRATE (PF) 100 MCG/2ML IJ SOLN
100.0000 ug | Freq: Once | INTRAMUSCULAR | Status: AC
  Administered 2017-09-28: 100 ug via INTRAVENOUS

## 2017-09-28 MED ORDER — LORAZEPAM 1 MG PO TABS: 1.0000 mg | ORAL_TABLET | ORAL | Status: DC | PRN

## 2017-09-28 MED ORDER — DIPHENHYDRAMINE HCL 12.5 MG/5ML PO ELIX: 25.0000 mg | ORAL_SOLUTION | ORAL | Status: DC | PRN

## 2017-09-28 MED ORDER — CEFAZOLIN SODIUM 1 G IJ SOLR
INTRAMUSCULAR | Status: AC
  Filled 2017-09-28: qty 10

## 2017-09-28 MED ORDER — BUPIVACAINE LIPOSOME 1.3 % IJ SUSP
INTRAMUSCULAR | Status: DC | PRN
  Administered 2017-09-28: 20 mL

## 2017-09-28 MED ORDER — ONDANSETRON HCL 4 MG/2ML IJ SOLN
INTRAMUSCULAR | Status: DC | PRN
  Administered 2017-09-28: 4 mg via INTRAVENOUS

## 2017-09-28 MED ORDER — VANCOMYCIN HCL 1000 MG IV SOLR
INTRAVENOUS | Status: AC
  Filled 2017-09-28: qty 1000

## 2017-09-28 MED ORDER — SENNOSIDES-DOCUSATE SODIUM 8.6-50 MG PO TABS: 1.0000 | ORAL_TABLET | Freq: Every evening | ORAL | 1 refills | Status: DC | PRN

## 2017-09-28 MED ORDER — BUPIVACAINE LIPOSOME 1.3 % IJ SUSP
20.0000 mL | Freq: Once | INTRAMUSCULAR | Status: DC
  Filled 2017-09-28: qty 20

## 2017-09-28 MED ORDER — PROPOFOL 10 MG/ML IV BOLUS
INTRAVENOUS | Status: DC | PRN
  Administered 2017-09-28 (×2): 20 mg via INTRAVENOUS

## 2017-09-28 MED ORDER — ALUM & MAG HYDROXIDE-SIMETH 200-200-20 MG/5ML PO SUSP: 30.0000 mL | ORAL | Status: DC | PRN

## 2017-09-28 MED ORDER — POLYETHYLENE GLYCOL 3350 17 G PO PACK: 17.0000 g | PACK | Freq: Every day | ORAL | Status: DC | PRN

## 2017-09-28 MED ORDER — FENTANYL CITRATE (PF) 100 MCG/2ML IJ SOLN
INTRAMUSCULAR | Status: DC | PRN
  Administered 2017-09-28: 50 ug via INTRAVENOUS

## 2017-09-28 MED ORDER — SODIUM CHLORIDE 0.9 % IR SOLN
Status: DC | PRN
  Administered 2017-09-28: 3000 mL

## 2017-09-28 MED ORDER — OXYCODONE HCL 5 MG PO TABS: 5.0000 mg | ORAL_TABLET | ORAL | 0 refills | Status: DC | PRN

## 2017-09-28 MED ORDER — ONDANSETRON HCL 4 MG PO TABS: 4.0000 mg | ORAL_TABLET | Freq: Four times a day (QID) | ORAL | Status: DC | PRN

## 2017-09-28 MED ORDER — SORBITOL 70 % SOLN
30.0000 mL | Freq: Every day | Status: DC | PRN
  Filled 2017-09-28: qty 30

## 2017-09-28 MED FILL — tiZANidine HCL 4 MG TABS: 4 | 8 days supply | Qty: 30 | Fill #0

## 2017-09-28 SURGICAL SUPPLY — 67 items
ALCOHOL ISOPROPYL (RUBBING) (MISCELLANEOUS) ×3 IMPLANT
BAG DECANTER FOR FLEXI CONT (MISCELLANEOUS) ×3 IMPLANT
BANDAGE ACE 6X5 VEL STRL LF (GAUZE/BANDAGES/DRESSINGS) IMPLANT
BANDAGE ELASTIC 6 VELCRO ST LF (GAUZE/BANDAGES/DRESSINGS) ×3 IMPLANT
BANDAGE ESMARK 6X9 LF (GAUZE/BANDAGES/DRESSINGS) ×1 IMPLANT
BENZOIN TINCTURE PRP APPL 2/3 (GAUZE/BANDAGES/DRESSINGS) ×3 IMPLANT
BLADE SAW SGTL 13.0X1.19X90.0M (BLADE) IMPLANT
BNDG ESMARK 6X9 LF (GAUZE/BANDAGES/DRESSINGS) ×3
BOWL SMART MIX CTS (DISPOSABLE) ×3 IMPLANT
CAP KNEE PARTIAL 2 ×1 IMPLANT
CAPT KNEE PARTIAL 2 ×2 IMPLANT
CEMENT BONE REFOBACIN R1X40 US (Cement) ×3 IMPLANT
CLOSURE STERI-STRIP 1/2X4 (GAUZE/BANDAGES/DRESSINGS) ×1
CLSR STERI-STRIP ANTIMIC 1/2X4 (GAUZE/BANDAGES/DRESSINGS) ×2 IMPLANT
COVER SURGICAL LIGHT HANDLE (MISCELLANEOUS) ×3 IMPLANT
CUFF TOURNIQUET SINGLE 34IN LL (TOURNIQUET CUFF) ×3 IMPLANT
CUFF TOURNIQUET SINGLE 44IN (TOURNIQUET CUFF) IMPLANT
DRAPE EXTREMITY T 121X128X90 (DRAPE) ×3 IMPLANT
DRAPE HALF SHEET 40X57 (DRAPES) ×3 IMPLANT
DRAPE INCISE IOBAN 66X45 STRL (DRAPES) IMPLANT
DRAPE ORTHO SPLIT 77X108 STRL (DRAPES) ×4
DRAPE SURG 17X11 SM STRL (DRAPES) ×6 IMPLANT
DRAPE SURG ORHT 6 SPLT 77X108 (DRAPES) ×2 IMPLANT
DRSG AQUACEL AG ADV 3.5X10 (GAUZE/BANDAGES/DRESSINGS) ×3 IMPLANT
DRSG AQUACEL AG ADV 3.5X14 (GAUZE/BANDAGES/DRESSINGS) ×3 IMPLANT
DURAPREP 26ML APPLICATOR (WOUND CARE) ×6 IMPLANT
ELECT CAUTERY BLADE 6.4 (BLADE) ×3 IMPLANT
ELECT REM PT RETURN 9FT ADLT (ELECTROSURGICAL) ×3
ELECTRODE REM PT RTRN 9FT ADLT (ELECTROSURGICAL) ×1 IMPLANT
GENDER SOLUTIONS PATELLO-FEMORAL JOINT SYSTEM PFJ MILLING BURR STANDARD ×3 IMPLANT
GLOVE SKINSENSE NS SZ7.5 (GLOVE) ×2
GLOVE SKINSENSE STRL SZ7.5 (GLOVE) ×1 IMPLANT
GLOVE SURG SYN 7.5  E (GLOVE) ×8
GLOVE SURG SYN 7.5 E (GLOVE) ×4 IMPLANT
GOWN STRL REIN XL XLG (GOWN DISPOSABLE) ×3 IMPLANT
GOWN STRL REUS W/ TWL LRG LVL3 (GOWN DISPOSABLE) ×1 IMPLANT
GOWN STRL REUS W/TWL LRG LVL3 (GOWN DISPOSABLE) ×2
HANDPIECE INTERPULSE COAX TIP (DISPOSABLE) ×2
HOOD PEEL AWAY FLYTE STAYCOOL (MISCELLANEOUS) ×6 IMPLANT
KIT BASIN OR (CUSTOM PROCEDURE TRAY) ×3 IMPLANT
KIT ROOM TURNOVER OR (KITS) ×3 IMPLANT
MANIFOLD NEPTUNE II (INSTRUMENTS) ×3 IMPLANT
MARKER SKIN DUAL TIP RULER LAB (MISCELLANEOUS) ×3 IMPLANT
MIS QUAD SPARING TOTAL KNEE PROCEDURE HEADED SCREW 33MM LENGTH ×9 IMPLANT
MIS QUAD SPARING TOTAL KNEE PROCEDURE HEADED SCREW 48MM ×3 IMPLANT
NEEDLE SPNL 18GX3.5 QUINCKE PK (NEEDLE) IMPLANT
NS IRRIG 1000ML POUR BTL (IV SOLUTION) ×3 IMPLANT
PACK TOTAL JOINT (CUSTOM PROCEDURE TRAY) ×3 IMPLANT
PAD ARMBOARD 7.5X6 YLW CONV (MISCELLANEOUS) ×3 IMPLANT
SAW OSC TIP CART 19.5X105X1.3 (SAW) ×3 IMPLANT
SET HNDPC FAN SPRY TIP SCT (DISPOSABLE) ×1 IMPLANT
STAPLER VISISTAT 35W (STAPLE) IMPLANT
SUCTION FRAZIER HANDLE 10FR (MISCELLANEOUS) ×2
SUCTION TUBE FRAZIER 10FR DISP (MISCELLANEOUS) ×1 IMPLANT
SUT ETHILON 2 0 FS 18 (SUTURE) IMPLANT
SUT MNCRL AB 4-0 PS2 18 (SUTURE) IMPLANT
SUT VIC AB 0 CT1 27 (SUTURE) ×4
SUT VIC AB 0 CT1 27XBRD ANBCTR (SUTURE) ×2 IMPLANT
SUT VIC AB 1 CTX 27 (SUTURE) ×9 IMPLANT
SUT VIC AB 2-0 CT1 27 (SUTURE) ×6
SUT VIC AB 2-0 CT1 TAPERPNT 27 (SUTURE) ×3 IMPLANT
SYR 50ML LL SCALE MARK (SYRINGE) ×3 IMPLANT
TOWEL OR 17X24 6PK STRL BLUE (TOWEL DISPOSABLE) ×3 IMPLANT
TOWEL OR 17X26 10 PK STRL BLUE (TOWEL DISPOSABLE) ×3 IMPLANT
TRAY CATH 16FR W/PLASTIC CATH (SET/KITS/TRAYS/PACK) IMPLANT
UNDERPAD 30X30 (UNDERPADS AND DIAPERS) ×3 IMPLANT
WRAP KNEE MAXI GEL POST OP (GAUZE/BANDAGES/DRESSINGS) ×3 IMPLANT

## 2017-09-28 NOTE — Anesthesia Procedure Notes (Signed)
Anesthesia Regional Block: Adductor canal block   Pre-Anesthetic Checklist: ,, timeout performed, Correct Patient, Correct Site, Correct Laterality, Correct Procedure, Correct Position, site marked, Risks and benefits discussed,  Surgical consent,  Pre-op evaluation,  At surgeon's request and post-op pain management  Laterality: Right  Prep: chloraprep       Needles:  Injection technique: Single-shot  Needle Type: Stimiplex     Needle Length: 9cm  Needle Gauge: 21     Additional Needles:   Procedures:,,,, ultrasound used (permanent image in chart),,,,  Narrative:  Start time: 09/28/2017 1:16 PM End time: 09/28/2017 1:21 PM Injection made incrementally with aspirations every 5 mL.  Performed by: Personally  Anesthesiologist: Lowella CurbMiller, Mylie Mccurley Ray, MD

## 2017-09-28 NOTE — Op Note (Signed)
   Date of Surgery: 09/28/2017  INDICATIONS: Colton Vang is a 41 y.o.-year-old male with a right patellofemoral arthritis that has failed conservative treatment;  The patient did consent to the procedure after discussion of the risks and benefits.  PREOPERATIVE DIAGNOSIS: Right patellofemoral arthritis  POSTOPERATIVE DIAGNOSIS: Same.  PROCEDURE: Right patellofemoral arthroplasty with resurfacing of the patella and replacement of the femoral trochlea  SURGEON: N. Glee ArvinMichael Xu, M.D.  ASSIST: April Chilton SiGreen, RNFA.  ANESTHESIA:  spinal  IV FLUIDS AND URINE: See anesthesia.  ESTIMATED BLOOD LOSS: Minimal mL.  IMPLANTS: Zimmer  DRAINS: None  COMPLICATIONS: None.  DESCRIPTION OF PROCEDURE: The patient was brought to the operating room and placed supine on the operating table.  The patient had been signed prior to the procedure and this was documented. The patient had the anesthesia placed by the anesthesiologist.  A time-out was performed to confirm that this was the correct patient, site, side and location. The patient did receive antibiotics prior to the incision and was re-dosed during the procedure as needed at indicated intervals.  A tourniquet was placed.  The patient had the operative extremity prepped and draped in the standard surgical fashion.    A midline incision was made over the anterior aspect of the knee.  Dissection was carried through the subcutaneous tissue.  Medial parapatellar arthrotomy was performed taking care not to damage the underlying cartilage or the meniscus.  The patella was then everted and the undersurface of the patella was resected using the patellar cutting guide and oscillating saw.  The patella was sized to a 38 mm button.  The patella was then subluxed laterally and the knee was flexed to 45 degrees.  Retractors were placed for exposure.  The intramedullary drill was placed approximately a centimeter  anterior to the attachment of the PCL.  The guide was then  inserted down the femoral canal.  The guide was placed parallel to the epicondylar axis.  Resection of the anterior femur was then performed using oscillating saw through the guide.  Care was taken not to notch the femur.  The resection guide was then removed.  Size 4 femoral trochlea component was then placed on the cut surface.  Drill holes were then placed.  The femoral trochlea was then prepared using a high-speed bur.  Once this was done the resection guide was removed and the trial femoral trochlea was impacted into place.  Range of motion of the knee was performed and showed excellent patella tracking without any subluxation or impingement.  The trial components were then removed.  The knee joint was thoroughly lavaged.  Dilute solution of Exparel was injected into the soft tissues.  The final components were then cemented into place.  The joint was again thoroughly lavaged with normal saline.  Arthrotomy was closed with #1 Vicryl.  Subcutaneous layer was closed with 0 Vicryl.  Subcuticular layer was closed with 2-0 Vicryl and the skin was closed with 3-0 Monocryl.  Sterile dressings were applied.  Patient tolerated procedure well had no major complications.  POSTOPERATIVE PLAN: Patient will be admitted overnight for observation pain control.  He will mobilize with physical therapy in the morning.  Mayra ReelN. Michael Xu, MD [facility] [phone] 4:11 PM

## 2017-09-28 NOTE — Progress Notes (Signed)
Orthopedic Tech Progress Note Patient Details:  Colton PizzaJeffrey A Minar Jr. 1976-02-02 [id number] Applied ohf to bed. CPM Right Knee CPM Right Knee: On Right Knee Flexion (Degrees): 90 Right Knee Extension (Degrees): 0  Post Interventions Patient Tolerated: Well Instructions Provided: Care of device  Jennye MoccasinHughes, Oleg Oleson Colton Vang 09/28/2017, 6:31 PM

## 2017-09-28 NOTE — Transfer of Care (Signed)
Immediate Anesthesia Transfer of Care Note  Patient: Colton Vang.  Procedure(s) Performed: Right patellofemoral arthroplasty (Right Knee)  Patient Location: PACU  Anesthesia Type:MAC and Spinal  Level of Consciousness: drowsy and patient cooperative  Airway & Oxygen Therapy: Patient Spontanous Breathing and Patient connected to nasal cannula oxygen  Post-op Assessment: Report given to RN  Post vital signs: Reviewed and stable  Last Vitals:  Vitals:   09/28/17 1325 09/28/17 1330  BP: 125/75 120/80  Pulse: (!) 106 (!) 102  Resp: 15 (!) 9  Temp:    SpO2: 98% 98%    Last Pain:  Vitals:   09/28/17 1228  TempSrc:   PainSc: 5       Patients Stated Pain Goal: 3 (67/20/94 7096)  Complications: No apparent anesthesia complications

## 2017-09-28 NOTE — Anesthesia Preprocedure Evaluation (Signed)
Anesthesia Evaluation  Patient identified by MRN, date of birth, ID band Patient awake    Reviewed: Allergy & Precautions, NPO status , Patient's Chart, lab work & pertinent test results  Airway Mallampati: II  TM Distance: >3 FB Neck ROM: Full    Dental no notable dental hx.    Pulmonary neg pulmonary ROS, asthma , sleep apnea and Continuous Positive Airway Pressure Ventilation , COPD, Current Smoker,    Pulmonary exam normal breath sounds clear to auscultation       Cardiovascular hypertension, Pt. on medications negative cardio ROS Normal cardiovascular exam Rhythm:Regular Rate:Normal     Neuro/Psych  Headaches, Anxiety Depression Bipolar Disorder negative neurological ROS  negative psych ROS   GI/Hepatic negative GI ROS, Neg liver ROS, GERD  ,  Endo/Other  negative endocrine ROSHypothyroidism   Renal/GU negative Renal ROS  negative genitourinary   Musculoskeletal negative musculoskeletal ROS (+) Arthritis , Osteoarthritis,    Abdominal (+) + obese,   Peds negative pediatric ROS (+)  Hematology negative hematology ROS (+)   Anesthesia Other Findings   Reproductive/Obstetrics negative OB ROS                             Anesthesia Physical Anesthesia Plan  ASA: II  Anesthesia Plan: Spinal   Post-op Pain Management:  Regional for Post-op pain   Induction: Intravenous  PONV Risk Score and Plan: 0 and Ondansetron  Airway Management Planned: Simple Face Mask  Additional Equipment:   Intra-op Plan:   Post-operative Plan:   Informed Consent: I have reviewed the patients History and Physical, chart, labs and discussed the procedure including the risks, benefits and alternatives for the proposed anesthesia with the patient or authorized representative who has indicated his/her understanding and acceptance.   Dental advisory given  Plan Discussed with: CRNA  Anesthesia Plan  Comments:         Anesthesia Quick Evaluation

## 2017-09-28 NOTE — Anesthesia Procedure Notes (Signed)
Procedure Name: MAC Date/Time: 09/28/2017 2:36 PM Performed by: Barrington Ellison, CRNA Pre-anesthesia Checklist: Patient identified, Emergency Drugs available, Suction available and Patient being monitored Patient Re-evaluated:Patient Re-evaluated prior to induction Oxygen Delivery Method: Simple face mask

## 2017-09-28 NOTE — H&P (Signed)
PREOPERATIVE H&P  Chief Complaint: right patellofemoral degenerative joint disease  HPI: Colton PizzaJeffrey A Luczynski Jr. is a 41 y.o. male who presents for surgical treatment of right patellofemoral degenerative joint disease.  He denies any changes in medical history.  Past Medical History:  Diagnosis Date  . Alcoholism (HCC)   . Arthritis   . Asthma   . Bipolar 1 disorder (HCC)   . COPD (chronic obstructive pulmonary disease) (HCC)   . Depression   . DJD (degenerative joint disease)   . Family history of adverse reaction to anesthesia    grandfather was difficult to wake up  . Fatty liver   . GERD (gastroesophageal reflux disease)   . Glaucoma   . Headache   . Hypertension   . Hypothyroidism   . Major depressive disorder   . Sleep apnea    wears CPAP #7   Past Surgical History:  Procedure Laterality Date  . Cervical Disk replacement     . GALLBLADDER SURGERY  2006  . HERNIA REPAIR    . left ankle surgery     . VASECTOMY     Social History   Socioeconomic History  . Marital status: Married    Spouse name: None  . Number of children: None  . Years of education: None  . Highest education level: None  Social Needs  . Financial resource strain: None  . Food insecurity - worry: None  . Food insecurity - inability: None  . Transportation needs - medical: None  . Transportation needs - non-medical: None  Occupational History  . None  Tobacco Use  . Smoking status: Current Every Day Smoker    Packs/day: 0.50    Types: Cigarettes  . Smokeless tobacco: Former NeurosurgeonUser    Types: Snuff  Substance and Sexual Activity  . Alcohol use: No  . Drug use: No  . Sexual activity: Yes    Partners: Female    Birth control/protection: None  Other Topics Concern  . None  Social History Narrative   He is on disability    Two adopted children and one biological    Family History  Problem Relation Age of Onset  . Arthritis Mother   . Alcohol abuse Father   . Arthritis Father   .  Hyperlipidemia Father   . Hypertension Father   . Glaucoma Father   . Alcohol abuse Maternal Grandfather   . Alcohol abuse Paternal Grandfather   . Arthritis Paternal Grandfather   . Hyperlipidemia Paternal Grandfather   . Heart disease Paternal Grandfather   . Hypertension Paternal Grandfather   . Diabetes Other    Allergies  Allergen Reactions  . Hydrocodone Itching   Prior to Admission medications   Medication Sig Start Date End Date Taking? Authorizing Provider  albuterol (PROVENTIL HFA;VENTOLIN HFA) 108 (90 Base) MCG/ACT inhaler Inhale 2 puffs into the lungs every 4 (four) hours as needed for wheezing or shortness of breath. 06/28/17  Yes Nafziger, Kandee Keenory, NP  amitriptyline (ELAVIL) 100 MG tablet Take 1 tablet (100 mg total) by mouth at bedtime. 09/06/17  Yes Eksir, Bo McclintockAlexander Arya, MD  cetirizine (ZYRTEC) 10 MG chewable tablet Chew 1 tablet (10 mg total) by mouth daily. 06/28/17  Yes Nafziger, Kandee Keenory, NP  colestipol (COLESTID) 1 g tablet Take 1 tablet (1 g total) by mouth 2 (two) times daily. 06/29/17  Yes Nafziger, Kandee Keenory, NP  diclofenac sodium (VOLTAREN) 1 % GEL Apply 2 g topically 4 (four) times daily. Patient taking differently: Apply 2 g  topically 3 (three) times daily as needed (pain).  08/16/17  Yes Tarry KosXu, Mykenzie Ebanks M, MD  escitalopram (LEXAPRO) 20 MG tablet Take 1 tablet (20 mg total) by mouth every morning. 09/06/17 09/06/18 Yes Eksir, Bo McclintockAlexander Arya, MD  fluticasone furoate-vilanterol (BREO ELLIPTA) 100-25 MCG/INH AEPB Inhale 1 puff into the lungs daily. 06/28/17  Yes Nafziger, Kandee Keenory, NP  lisinopril (PRINIVIL,ZESTRIL) 10 MG tablet Take 1 tablet (10 mg total) by mouth daily. 06/28/17  Yes Nafziger, Kandee Keenory, NP  LORazepam (ATIVAN) 1 MG tablet Take 1 mg by mouth every 4 (four) hours as needed for anxiety.   Yes [provider]  lurasidone (LATUDA) 80 MG TABS tablet Take 1 tablet (80 mg total) by mouth every evening. 09/06/17  Yes Eksir, Bo McclintockAlexander Arya, MD  mometasone (NASONEX) 50 MCG/ACT  nasal spray Place 2 sprays into the nose daily. 06/28/17  Yes Nafziger, Kandee Keenory, NP  montelukast (SINGULAIR) 10 MG tablet Take 1 tablet (10 mg total) by mouth at bedtime. 06/28/17  Yes Nafziger, Kandee Keenory, NP  pravastatin (PRAVACHOL) 20 MG tablet Take 1 tablet (20 mg total) by mouth at bedtime. 06/28/17  Yes Nafziger, Kandee Keenory, NP  prazosin (MINIPRESS) 2 MG capsule Take 1 capsule (2 mg total) by mouth at bedtime. 09/06/17  Yes Eksir, Bo McclintockAlexander Arya, MD  ranitidine (ZANTAC) 150 MG tablet Take 1 tablet (150 mg total) by mouth 2 (two) times daily. 06/29/17  Yes Nafziger, Kandee Keenory, NP     Positive ROS: All other systems have been reviewed and were otherwise negative with the exception of those mentioned in the HPI and as above.  Physical Exam: General: Alert, no acute distress Cardiovascular: No pedal edema Respiratory: No cyanosis, no use of accessory musculature GI: abdomen soft Skin: No lesions in the area of chief complaint Neurologic: Sensation intact distally Psychiatric: Patient is competent for consent with normal mood and affect Lymphatic: no lymphedema  MUSCULOSKELETAL: exam stable  Assessment: right patellofemoral degenerative joint disease  Plan: Plan for Procedure(s): Right patellofemoral arthroplasty  The risks benefits and alternatives were discussed with the patient including but not limited to the risks of nonoperative treatment, versus surgical intervention including infection, bleeding, nerve injury,  blood clots, cardiopulmonary complications, morbidity, mortality, among others, and they were willing to proceed.   Glee ArvinMichael Naiyana Barbian, MD   09/28/2017 9:34 AM

## 2017-09-28 NOTE — Anesthesia Procedure Notes (Signed)
Spinal  Patient location during procedure: OR Start time: 09/28/2017 2:41 PM End time: 09/28/2017 2:46 PM Staffing Anesthesiologist: Lowella CurbMiller, Myrtice Lowdermilk Ray, MD Performed: anesthesiologist  Preanesthetic Checklist Completed: patient identified, site marked, surgical consent, pre-op evaluation, timeout performed, IV checked, risks and benefits discussed and monitors and equipment checked Spinal Block Patient position: sitting Prep: Betadine Patient monitoring: heart rate, cardiac monitor, continuous pulse ox and blood pressure Approach: midline Location: L3-4 Injection technique: single-shot Needle Needle type: Pencan  Needle gauge: 24 G Needle length: 9 cm

## 2017-09-28 NOTE — Discharge Instructions (Signed)

## 2017-09-29 ENCOUNTER — Ambulatory Visit (HOSPITAL_COMMUNITY): Payer: Self-pay | Admitting: Psychiatry

## 2017-09-29 ENCOUNTER — Encounter (HOSPITAL_COMMUNITY): Payer: Self-pay | Admitting: General Practice

## 2017-09-29 DIAGNOSIS — F319 Bipolar disorder, unspecified: Secondary | ICD-10-CM | POA: Diagnosis not present

## 2017-09-29 DIAGNOSIS — H409 Unspecified glaucoma: Secondary | ICD-10-CM | POA: Diagnosis not present

## 2017-09-29 DIAGNOSIS — E039 Hypothyroidism, unspecified: Secondary | ICD-10-CM | POA: Diagnosis not present

## 2017-09-29 DIAGNOSIS — G4730 Sleep apnea, unspecified: Secondary | ICD-10-CM | POA: Diagnosis not present

## 2017-09-29 DIAGNOSIS — M1711 Unilateral primary osteoarthritis, right knee: Secondary | ICD-10-CM | POA: Diagnosis not present

## 2017-09-29 DIAGNOSIS — K219 Gastro-esophageal reflux disease without esophagitis: Secondary | ICD-10-CM | POA: Diagnosis not present

## 2017-09-29 DIAGNOSIS — J449 Chronic obstructive pulmonary disease, unspecified: Secondary | ICD-10-CM | POA: Diagnosis not present

## 2017-09-29 DIAGNOSIS — Z79899 Other long term (current) drug therapy: Secondary | ICD-10-CM | POA: Diagnosis not present

## 2017-09-29 DIAGNOSIS — I10 Essential (primary) hypertension: Secondary | ICD-10-CM | POA: Diagnosis not present

## 2017-09-29 LAB — BASIC METABOLIC PANEL
ANION GAP: 8 (ref 5–15)
BUN: 7 mg/dL (ref 6–20)
CHLORIDE: 102 mmol/L (ref 101–111)
CO2: 25 mmol/L (ref 22–32)
Calcium: 8.5 mg/dL — ABNORMAL LOW (ref 8.9–10.3)
Creatinine, Ser: 1.19 mg/dL (ref 0.61–1.24)
GFR calc non Af Amer: >60 mL/min (ref >60)
Glucose, Bld: 106 mg/dL — ABNORMAL HIGH (ref 65–99)
POTASSIUM: 3.9 mmol/L (ref 3.5–5.1)
Sodium: 135 mmol/L (ref 135–145)

## 2017-09-29 LAB — CBC
HEMATOCRIT: 39.3 % (ref 39.0–52.0)
HEMOGLOBIN: 13 g/dL (ref 13.0–17.0)
MCH: 29.4 pg (ref 26.0–34.0)
MCHC: 33.1 g/dL (ref 30.0–36.0)
MCV: 88.9 fL (ref 78.0–100.0)
Platelets: 197 10*3/uL (ref 150–400)
RBC: 4.42 MIL/uL (ref 4.22–5.81)
RDW: 13.2 % (ref 11.5–15.5)
WBC: 9.6 10*3/uL (ref 4.0–10.5)

## 2017-09-29 MED FILL — PROMETHAZINE 25 MG TABLET: 25 | 8 days supply | Qty: 30 | Fill #0

## 2017-09-29 MED FILL — oxyCODONE HCL 5 MG TABS: 5 | 2 days supply | Qty: 30 | Fill #0

## 2017-09-29 MED FILL — ASPIRIN EC 325 MG TABLET: 325 | 42 days supply | Qty: 84 | Fill #0

## 2017-09-29 MED FILL — ONDANSETRON HCL 4 MG TABLET: 4 | 7 days supply | Qty: 40 | Fill #0

## 2017-09-29 MED FILL — oxyCODONE HCL ER 10 MG T12A: 10 | 5 days supply | Qty: 10 | Fill #0

## 2017-09-29 NOTE — Discharge Summary (Signed)
Physician Discharge Summary      Patient ID: Colton Vang. MRN: [id number] DOB/AGE: 1976-05-16 41 y.o.  Admit date: 09/28/2017 Discharge date: 09/29/2017  Admission Diagnoses:  <principal problem not specified>  Discharge Diagnoses:  Active Problems:   Status post right partial knee replacement   Past Medical History:  Diagnosis Date  . Alcoholism (HCC)   . Arthritis   . Asthma   . Bipolar 1 disorder (HCC)   . COPD (chronic obstructive pulmonary disease) (HCC)   . Depression   . DJD (degenerative joint disease)   . Family history of adverse reaction to anesthesia    grandfather was difficult to wake up  . Fatty liver   . GERD (gastroesophageal reflux disease)   . Glaucoma   . Headache   . Hypertension   . Hypothyroidism   . Major depressive disorder   . Sleep apnea    wears CPAP #7    Surgeries: Procedure(s): Right patellofemoral arthroplasty on 09/28/2017   Consultants (if any):   Discharged Condition: Improved  Hospital Course: Colton Vang. is an 41 y.o. male who was admitted 09/28/2017 with a diagnosis of <principal problem not specified> and went to the operating room on 09/28/2017 and underwent the above named procedures.    He was given perioperative antibiotics:  Anti-infectives (From admission, onward)   Start     Dose/Rate Route Frequency Ordered Stop   09/28/17 2100  ceFAZolin (ANCEF) IVPB 2g/100 mL premix     2 g 200 mL/hr over 30 Minutes Intravenous Every 6 hours 09/28/17 1959 09/29/17 1459   09/28/17 1521  vancomycin (VANCOCIN) powder  Status:  Discontinued       As needed 09/28/17 1522 09/28/17 1636   09/21/17 0945  ceFAZolin (ANCEF) 3 g in dextrose 5 % 50 mL IVPB     3 g 130 mL/hr over 30 Minutes Intravenous To Surgery 09/20/17 0843 09/22/17 0945    .  He was given sequential compression devices, early ambulation, and aspirin for DVT prophylaxis.  He benefited maximally from the hospital stay and there were no  complications.    Recent vital signs:  Vitals:   09/28/17 2008 09/29/17 0505  BP: 125/85 120/65  Pulse: (!) 108 100  Resp: 16 16  Temp: 98.7 F (37.1 C) 98.8 F (37.1 C)  SpO2: 97% 96%    Recent laboratory studies:  Lab Results  Component Value Date   HGB 16.1 09/27/2017   HGB 15.4 08/12/2017   Lab Results  Component Value Date   WBC 11.9 (H) 09/27/2017   PLT 252 09/27/2017   Lab Results  Component Value Date   INR 0.92 09/27/2017   Lab Results  Component Value Date   NA 136 09/27/2017   K 3.4 (L) 09/27/2017   CL 103 09/27/2017   CO2 21 (L) 09/27/2017   BUN 8 09/27/2017   CREATININE 1.16 09/27/2017   GLUCOSE 90 09/27/2017    Discharge Medications:   Allergies as of 09/29/2017      Reactions   Hydrocodone Itching      Medication List    TAKE these medications   albuterol 108 (90 Base) MCG/ACT inhaler Commonly known as:  PROVENTIL HFA;VENTOLIN HFA Inhale 2 puffs into the lungs every 4 (four) hours as needed for wheezing or shortness of breath.   amitriptyline 100 MG tablet Commonly known as:  ELAVIL Take 1 tablet (100 mg total) by mouth at bedtime.   aspirin EC 325 MG tablet Take 1  tablet (325 mg total) by mouth 2 (two) times daily.   cetirizine 10 MG tablet Commonly known as:  ZYRTEC Take 10 mg by mouth daily.   cetirizine 10 MG chewable tablet Commonly known as:  ZYRTEC Chew 1 tablet (10 mg total) by mouth daily.   colestipol 1 g tablet Commonly known as:  COLESTID Take 1 tablet (1 g total) by mouth 2 (two) times daily.   diclofenac sodium 1 % Gel Commonly known as:  VOLTAREN Apply 2 g topically 4 (four) times daily. What changed:    when to take this  reasons to take this   escitalopram 20 MG tablet Commonly known as:  LEXAPRO Take 1 tablet (20 mg total) by mouth every morning.   fluticasone furoate-vilanterol 100-25 MCG/INH Aepb Commonly known as:  BREO ELLIPTA Inhale 1 puff into the lungs daily.   lisinopril 10 MG  tablet Commonly known as:  PRINIVIL,ZESTRIL Take 1 tablet (10 mg total) by mouth daily.   LORazepam 1 MG tablet Commonly known as:  ATIVAN Take 1 mg by mouth every 4 (four) hours as needed for anxiety.   lurasidone 80 MG Tabs tablet Commonly known as:  LATUDA Take 1 tablet (80 mg total) by mouth every evening.   mometasone 50 MCG/ACT nasal spray Commonly known as:  NASONEX Place 2 sprays into the nose daily.   montelukast 10 MG tablet Commonly known as:  SINGULAIR Take 1 tablet (10 mg total) by mouth at bedtime.   ondansetron 4 MG tablet Commonly known as:  ZOFRAN Take 1-2 tablets (4-8 mg total) by mouth every 8 (eight) hours as needed for nausea or vomiting.   oxyCODONE 10 mg 12 hr tablet Commonly known as:  OXYCONTIN Take 1 tablet (10 mg total) by mouth every 12 (twelve) hours.   oxyCODONE 5 MG immediate release tablet Commonly known as:  Oxy IR/ROXICODONE Take 1-3 tablets (5-15 mg total) by mouth every 4 (four) hours as needed.   pravastatin 20 MG tablet Commonly known as:  PRAVACHOL Take 1 tablet (20 mg total) by mouth at bedtime.   prazosin 2 MG capsule Commonly known as:  MINIPRESS Take 1 capsule (2 mg total) by mouth at bedtime.   PREVACID SOLUTAB 30 MG disintegrating tablet Generic drug:  lansoprazole Take 30 mg by mouth 2 (two) times daily as needed.   promethazine 25 MG tablet Commonly known as:  PHENERGAN Take 1 tablet (25 mg total) by mouth every 6 (six) hours as needed for nausea.   ranitidine 150 MG tablet Commonly known as:  ZANTAC Take 1 tablet (150 mg total) by mouth 2 (two) times daily.   senna-docusate 8.6-50 MG tablet Commonly known as:  SENOKOT S Take 1 tablet by mouth at bedtime as needed.   tiZANidine 4 MG tablet Commonly known as:  ZANAFLEX Take 1 tablet (4 mg total) by mouth every 6 (six) hours as needed for muscle spasms.            Durable Medical Equipment  (From admission, onward)        Start     Ordered   09/28/17  2000  DME Walker rolling  Once    Question:  Patient needs a walker to treat with the following condition  Answer:  Total knee replacement status   09/28/17 1959   09/28/17 2000  DME 3 n 1  Once     09/28/17 1959   09/28/17 2000  DME Bedside commode  Once    Question:  Patient  needs a bedside commode to treat with the following condition  Answer:  Total knee replacement status   09/28/17 1959      Diagnostic Studies: Mr Knee Right W/o Contrast  Result Date: 09/06/2017 CLINICAL DATA:  Bilateral knee pain for several years. No specific injury. EXAM: MRI OF THE RIGHT KNEE WITHOUT CONTRAST TECHNIQUE: Multiplanar, multisequence MR imaging of the knee was performed. No intravenous contrast was administered. COMPARISON:  Radiographs 08/16/2017 FINDINGS: MENISCI Medial meniscus:  Intact Lateral meniscus:  Intact LIGAMENTS Cruciates: Intact. Mild mucoid degeneration of the ACL and to a lesser extent the PCL. Collaterals:  Intact CARTILAGE Patellofemoral: The moderate degenerative chondrosis mainly along the medial facet and patellar apex with chondral fissuring and cartilage thinning. No full-thickness cartilage defect. Medial: Mild to moderate degenerative chondrosis mainly along the lateral aspect of the medial femoral condyles. Lateral: Minimal degenerative chondrosis involving the tibial articular cartilage. Joint: Small joint effusion. Mild thickening and edema of the quadriceps fat pad could cause symptoms of anterior impingement. Popliteal Fossa: Very small Baker's cyst. Some fluid tracking back along the popliteus tendon. Extensor Mechanism: The patella retinacular structures are intact and the quadriceps and patellar tendons are intact. Mild proximal patellar tendinopathy. Bones:  No acute bony findings. Other: Normal knee musculature. IMPRESSION: 1. Intact ligamentous structures and no acute bony findings. 2. No meniscal tears. 3. Patellofemoral degenerative chondrosis. 4. Mild thickening and edema of  the quadriceps fat pad. 5. Small joint effusion and very small Baker's cyst. 6. Mild proximal patellar tendinopathy. Electronically Signed   By: Rudie MeyerP.  Gallerani M.D.   On: 09/06/2017 10:32   Mr Knee Left W/o Contrast  Result Date: 09/06/2017 CLINICAL DATA:  Bilateral knee pain and weakness for several years. No specific injury or previous surgery. EXAM: MRI OF THE LEFT KNEE WITHOUT CONTRAST TECHNIQUE: Multiplanar, multisequence MR imaging of the knee was performed. No intravenous contrast was administered. COMPARISON:  None. FINDINGS: MENISCI Medial meniscus:  Intact Lateral meniscus:  Intact LIGAMENTS Cruciates: Intact. Mild mucoid degeneration of the ACL versus previous ACL sprain. Collaterals:  Intact CARTILAGE Patellofemoral: Moderate degenerative chondrosis mainly involving the medial facet with deep chondral fissures, moderate cartilage thinning and small surface tears. No full-thickness defect or delamination injury. Medial: Mild to moderate degenerative chondrosis mainly involving the lateral aspect of the medial femoral condyles. Lateral: Mild degenerative chondrosis mainly involving the tibial articular cartilage. No cartilage defects. Joint: No joint effusion. Mild thickening and edema of the quadriceps fat pad could potentially cause anterior impingement symptoms. Popliteal Fossa:  No popliteal mass or Baker's cyst. Extensor Mechanism: The patella retinacular structures are intact. Mild proximal and distal patellar tendinopathy. The quadriceps tendon is intact. Normal TT-TG distance Bones:  No acute bony findings. Other: Normal knee musculature. IMPRESSION: 1. 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 2. Mild to moderate medial and lateral compartment degenerative chondrosis. 3. Intact ligamentous structures and no meniscal tears. 4. Thickening and edema of the quadriceps fat pad could potentially causing anterior impingement symptoms. 5. Mild patellar  tendinopathy. Electronically Signed   By: Rudie MeyerP.  Gallerani M.D.   On: 09/06/2017 10:17   Dg Knee Right Port  Result Date: 09/28/2017 CLINICAL DATA:  Status post patellofemoral arthroplasty. EXAM: PORTABLE RIGHT KNEE - 1-2 VIEW COMPARISON:  None. FINDINGS: Radiographic appearance status post patellofemoral arthroplasty with resurfaced patella and femoral trochlea component showing normal alignment. Expected soft tissue air present postoperatively as well as soft tissue swelling anterior to the distal femur. No fracture or abnormal lucency surrounding  the trochlea component. IMPRESSION: Normal alignment following right patellofemoral arthroplasty. Electronically Signed   By: Irish Lack M.D.   On: 09/28/2017 19:44    Disposition: Final discharge disposition not confirmed  Discharge Instructions    Call MD / Call 911   Complete by:  As directed    If you experience chest pain or shortness of breath, CALL 911 and be transported to the hospital emergency room.  If you develope a fever above 101.5 F, pus (white drainage) or increased drainage or redness at the wound, or calf pain, call your surgeon's office.   Constipation Prevention   Complete by:  As directed    Drink plenty of fluids.  Prune juice may be helpful.  You may use a stool softener, such as Colace (over the counter) 100 mg twice a day.  Use MiraLax (over the counter) for constipation as needed.   Driving restrictions   Complete by:  As directed    No driving while taking narcotic pain meds.   Increase activity slowly as tolerated   Complete by:  As directed       Follow-up Information    Tarry Kos, MD In 2 weeks.   Specialty:  Orthopedic Surgery Why:  For suture removal, For wound re-check Contact information: [address] [place] [address] [phone]            Signed: Glee Arvin 09/29/2017, 7:36 AM

## 2017-09-29 NOTE — Evaluation (Signed)
Physical Therapy Evaluation & Discharge Patient Details Name: Colton Vang. MRN: [id number] DOB: 02/03/1976 Today's Date: 09/29/2017   History of Present Illness  Pt is a 41 y.o. male s/p R partial TKA on 09/28/17. Pertinent PMH includes HTN, glaucoma, depression, DJD, COPD, cervical disc replacement.     Clinical Impression  Patient evaluated by Physical Therapy with no further acute PT needs identified. PTA, pt mod indep with SPC secondary to R knee pain; lives with family available for support. Education on precautions, positioning, therex (HEP handout provided and reviewed), fall risk reduction, and importance of continued mobility. Today, pt mod indep for ambulation with RW and stairs with use of rail. All education has been completed and the patient has no further questions. Feel pt would benefit from HHPT to maximize functional mobility and independence. Acute PT is signing off. Thank you for this referral.    Follow Up Recommendations Home health PT;Supervision - Intermittent    Equipment Recommendations  Rolling walker with 5" wheels;3in1 (PT)    Recommendations for Other Services OT consult     Precautions / Restrictions Precautions Precautions: Knee Precaution Booklet Issued: Yes (comment) Precaution Comments: Verbally reviewed precautions & HEP handout provided Restrictions Weight Bearing Restrictions: Yes RLE Weight Bearing: Weight bearing as tolerated      Mobility  Bed Mobility Overal bed mobility: Independent                Transfers Overall transfer level: Modified independent Equipment used: Rolling walker (2 wheeled)             General transfer comment: Educ on correct hand placement. Encouraged to achieve 90' knee flexion when going to sit down  Ambulation/Gait Ambulation/Gait assistance: Modified independent (Device/Increase time) Ambulation Distance (Feet): 220 Feet Assistive device: Rolling walker (2 wheeled) Gait  Pattern/deviations: Step-through pattern;Decreased stride length;Decreased weight shift to right;Antalgic Gait velocity: Decreased Gait velocity interpretation: <1.8 ft/sec, indicative of risk for recurrent falls General Gait Details: Pt able to progress to heel-to-toe gait pattern with educ and cues. Overall, good technique with RW.   Stairs Stairs: Yes Stairs assistance: Modified independent (Device/Increase time) Stair Management: One rail Right;Step to pattern;Sideways Number of Stairs: 12 General stair comments: Ascend/descended 12 steps with BUE support on R-side rail. Educ on technique. Wife present for education and will be able to provide assist at home  Wheelchair Mobility    Modified Rankin (Stroke Patients Only)       Balance Overall balance assessment: Needs assistance   Sitting balance-Leahy Scale: Good Sitting balance - Comments: Able to don socks sitting EOB     Standing balance-Leahy Scale: Fair Standing balance comment: Can static stand with no UE support                             Pertinent Vitals/Pain Pain Assessment: 0-10 Pain Score: 7  Pain Location: R lateral knee Pain Descriptors / Indicators: Aching;Sore;Operative site guarding Pain Intervention(s): Premedicated before session;Monitored during session    [place] expects to be discharged to:: Private residence Living Arrangements: Spouse/significant other Available Help at Discharge: Family;Available PRN/intermittently Type of Home: Apartment Home Access: Stairs to enter Entrance Stairs-Rails: Right;Left Entrance Stairs-Number of Steps: 3 flights Home Layout: One level Home Equipment: Cane - single point Additional Comments: Wife works as Therapist, sports. Have 3 kids    Prior Function Level of Independence: Independent with assistive device(s)         Comments: Mod indep  with SPC secondary to R knee pain; drives. Pt on disability and does not work     Journalist, newspaper         Extremity/Trunk Assessment   Upper Extremity Assessment Upper Extremity Assessment: Overall WFL for tasks assessed    Lower Extremity Assessment Lower Extremity Assessment: RLE deficits/detail RLE Deficits / Details: grossly 3/5 throughout       Communication   Communication: No difficulties  Cognition Arousal/Alertness: Awake/alert Behavior During Therapy: WFL for tasks assessed/performed Overall Cognitive Status: Within Functional Limits for tasks assessed                                        General Comments General comments (skin integrity, edema, etc.): Wife present throughout session and very supportive    Exercises Total Joint Exercises Long Arc Quad: AROM;Right;10 reps;Seated Knee Flexion: AAROM;Right;10 reps;Seated Goniometric ROM: R knee flexion grossly 5-80'   Assessment/Plan    PT Assessment All further PT needs can be met in the next venue of care  PT Problem List Decreased strength;Decreased range of motion;Decreased activity tolerance;Decreased balance;Decreased mobility;Pain       PT Treatment Interventions      PT Goals (Current goals can be found in the Care Plan section)  Acute Rehab PT Goals PT Goal Formulation: All assessment and education complete, DC therapy    Frequency     Barriers to discharge        Co-evaluation               AM-PAC PT "6 Clicks" Daily Activity  Outcome Measure Difficulty turning over in bed (including adjusting bedclothes, sheets and blankets)?: None Difficulty moving from lying on back to sitting on the side of the bed? : None Difficulty sitting down on and standing up from a chair with arms (e.g., wheelchair, bedside commode, etc,.)?: None Help needed moving to and from a bed to chair (including a wheelchair)?: None Help needed walking in hospital room?: None Help needed climbing 3-5 steps with a railing? : A Little 6 Click Score: 23    End of Session Equipment Utilized  During Treatment: Gait belt Activity Tolerance: Patient tolerated treatment well Patient left: in chair;with call bell/phone within reach;with family/visitor present Nurse Communication: Mobility status PT Visit Diagnosis: Other abnormalities of gait and mobility (R26.89);Pain Pain - Right/Left: Right Pain - part of body: Knee    Time: 4665-9935 PT Time Calculation (min) (ACUTE ONLY): 26 min   Charges:   PT Evaluation $PT Eval Low Complexity: 1 Low PT Treatments $Gait Training: 8-22 mins   PT G Codes:   PT G-Codes **NOT FOR INPATIENT CLASS** Functional Assessment Tool Used: AM-PAC 6 Clicks Basic Mobility Functional Limitation: Mobility: Walking and moving around Mobility: Walking and Moving Around Current Status (T0177): At least 1 percent but less than 20 percent impaired, limited or restricted Mobility: Walking and Moving Around Goal Status [phone]): At least 1 percent but less than 20 percent impaired, limited or restricted Mobility: Walking and Moving Around Discharge Status [phone]): At least 1 percent but less than 20 percent impaired, limited or restricted   Mabeline Caras, PT, DPT Acute Rehab Services  Pager: [place] 09/29/2017, 8:27 AM

## 2017-09-29 NOTE — Evaluation (Signed)
Occupational Therapy Evaluation Patient Details Name: Colton Vang. MRN: [id number] DOB: 01/17/1976 Today's Date: 09/29/2017    History of Present Illness Pt is a 41 y.o. male s/p R partial TKA on 09/28/17. Pertinent PMH includes HTN, glaucoma, depression, DJD, COPD, cervical disc replacement.    Clinical Impression   Pt is functioning modified independently in ADL. All education completed as detailed below. No further OT needs.    Follow Up Recommendations  No OT follow up    Equipment Recommendations  3 in 1 bedside commode    Recommendations for Other Services       Precautions / Restrictions Precautions Precautions: Knee Precaution Booklet Issued: Yes (comment) Precaution Comments: Verbally reviewed precautions & HEP handout provided Restrictions Weight Bearing Restrictions: Yes RLE Weight Bearing: Weight bearing as tolerated      Mobility Bed Mobility Overal bed mobility: Independent                Transfers Overall transfer level: Modified independent Equipment used: Rolling walker (2 wheeled)             General transfer comment: Educ on correct hand placement. Encouraged to achieve 90' knee flexion when going to sit down    Balance Overall balance assessment: Needs assistance   Sitting balance-Leahy Scale: Good Sitting balance - Comments: Able to don socks sitting EOB     Standing balance-Leahy Scale: Fair Standing balance comment: Can static stand with no UE support                           ADL either performed or assessed with clinical judgement   ADL Overall ADL's : Modified independent                                       General ADL Comments: pt able to don socks, but with difficulty, will have family assist. Educated in compensatory strategies, use of long bath sponge, set up of 3 in 1 over toilet and as a tub seat and in tub transfer with wife's assist. Instructed in how to transport items safely  with walker and safe footwear. Pt verbalizing understanding.     Vision Baseline Vision/History: Wears glasses Wears Glasses: At all times Patient Visual Report: No change from baseline       Perception     Praxis      Pertinent Vitals/Pain Pain Assessment: Faces Pain Score: 7  Faces Pain Scale: Hurts even more Pain Location: R lateral knee Pain Descriptors / Indicators: Aching;Sore;Operative site guarding Pain Intervention(s): Monitored during session;Premedicated before session;Repositioned;Ice applied     Hand Dominance Right   Extremity/Trunk Assessment Upper Extremity Assessment Upper Extremity Assessment: Overall WFL for tasks assessed   Lower Extremity Assessment Lower Extremity Assessment: Defer to PT evaluation RLE Deficits / Details: grossly 3/5 throughout       Communication Communication Communication: No difficulties   Cognition Arousal/Alertness: Awake/alert Behavior During Therapy: WFL for tasks assessed/performed Overall Cognitive Status: Within Functional Limits for tasks assessed                                     General Comments  Wife present throughout session and very supportive    Exercises Total Joint Exercises Long Arc Quad: AROM;Right;10 reps;Seated Knee Flexion: AAROM;Right;10 reps;Seated Goniometric  ROM: R knee flexion grossly 5-80'   Shoulder Instructions      Home Living Family/patient expects to be discharged to:: Private residence Living Arrangements: Spouse/significant other Available Help at Discharge: Family;Available PRN/intermittently Type of Home: Apartment Home Access: Stairs to enter Entergy CorporationEntrance Stairs-Number of Steps: 3 flights Entrance Stairs-Rails: Right;Left Home Layout: One level     Bathroom Shower/Tub: Chief Strategy OfficerTub/shower unit   Bathroom Toilet: Standard Bathroom Accessibility: Yes   Home Equipment: Gilmer Morane - single point   Additional Comments: Wife works as Charity fundraiserN. Has 3 kids      Prior  Functioning/Environment Level of Independence: Independent with assistive device(s)        Comments: Mod indep with SPC secondary to R knee pain; drives. Pt on disability and does not work        OT Problem List:        OT Treatment/Interventions:      OT Goals(Current goals can be found in the care plan section)    OT Frequency:     Barriers to D/C:            Co-evaluation              AM-PAC PT "6 Clicks" Daily Activity     Outcome Measure Help from another person eating meals?: None Help from another person taking care of personal grooming?: None Help from another person toileting, which includes using toliet, bedpan, or urinal?: None Help from another person bathing (including washing, rinsing, drying)?: None Help from another person to put on and taking off regular upper body clothing?: None Help from another person to put on and taking off regular lower body clothing?: None 6 Click Score: 24   End of Session CPM Right Knee CPM Right Knee: Off Additional Comments: Seated in chair  Activity Tolerance: Patient tolerated treatment well Patient left: in bed;with call bell/phone within reach;with family/visitor present  OT Visit Diagnosis: Pain                Time: [id number] OT Time Calculation (min): 18 min Charges:  OT General Charges $OT Visit: 1 Visit OT Evaluation $OT Eval Low Complexity: 1 Low G-Codes: OT G-codes **NOT FOR INPATIENT CLASS** Functional Assessment Tool Used: Clinical judgement Functional Limitation: Self care Self Care Current Status (G9562(G8987): At least 1 percent but less than 20 percent impaired, limited or restricted Self Care Goal Status (Z3086(G8988): At least 1 percent but less than 20 percent impaired, limited or restricted Self Care Discharge Status [phone](G8989): At least 1 percent but less than 20 percent impaired, limited or restricted   09/29/2017 Martie RoundJulie Sargent Mankey, OTR/L Pager: [phone]  [name], [name] 09/29/2017, 9:14 AM

## 2017-09-29 NOTE — Anesthesia Postprocedure Evaluation (Signed)
Anesthesia Post Note  Patient: Colton PizzaJeffrey A Sonntag Jr.  Procedure(s) Performed: Right patellofemoral arthroplasty (Right Knee)     Patient location during evaluation: PACU Anesthesia Type: Spinal Level of consciousness: oriented and awake and alert Pain management: pain level controlled Vital Signs Assessment: post-procedure vital signs reviewed and stable Respiratory status: spontaneous breathing and respiratory function stable Cardiovascular status: blood pressure returned to baseline and stable Postop Assessment: no headache, no backache and no apparent nausea or vomiting Anesthetic complications: no    Last Vitals:  Vitals:   09/28/17 2008 09/29/17 0505  BP: 125/85 120/65  Pulse: (!) 108 100  Resp: 16 16  Temp: 37.1 C 37.1 C  SpO2: 97% 96%    Last Pain:  Vitals:   09/29/17 0505  TempSrc: Oral  PainSc:                  Lowella CurbWarren Ray Crist Kruszka

## 2017-09-29 NOTE — Care Management Note (Signed)
Case Management Note  Patient Details  Name: Tollie PizzaJeffrey A Petrea Jr. MRN: [id number] Date of Birth: 11/16/1975  Subjective/Objective:                    Action/Plan:   Expected Discharge Date:  09/29/17               Expected Discharge Plan:  Home w Home Health Services  In-House Referral:     Discharge planning Services  CM Consult  Post Acute Care Choice:  Durable Medical Equipment, Home Health Choice offered to:  Spouse, Patient  DME Arranged:  3-N-1, Walker rolling DME Agency:  [facility]  HH Arranged:  [facility]  Status of Service:  Completed, signed off  If discussed at [employer] Length of Stay Meetings, dates discussed:    Additional Comments:  Kingsley PlanWile, Rayhaan Huster Marie, RN 09/29/2017, 10:06 AM

## 2017-09-29 NOTE — Progress Notes (Signed)
   Subjective:  Patient reports pain as moderate.    Objective:   VITALS:   Vitals:   09/28/17 1900 09/28/17 1915 09/28/17 2008 09/29/17 0505  BP:  129/90 125/85 120/65  Pulse: (!) 101 (!) 102 (!) 108 100  Resp: 15 14 16 16   Temp:   98.7 F (37.1 C) 98.8 F (37.1 C)  TempSrc:   Oral Oral  SpO2: 98% 98% 97% 96%  Weight:   267 lb (121.1 kg)   Height:   5\' 9"  (1.753 m)     Neurologically intact Neurovascular intact Sensation intact distally Intact pulses distally Dorsiflexion/Plantar flexion intact Incision: dressing C/D/I and no drainage No cellulitis present Compartment soft   Lab Results  Component Value Date   WBC 11.9 (H) 09/27/2017   HGB 16.1 09/27/2017   HCT 45.9 09/27/2017   MCV 86.4 09/27/2017   PLT 252 09/27/2017     Assessment/Plan:  1 Day Post-Op   - Expected postop acute blood loss anemia - will monitor for symptoms - Up with PT/OT - DVT ppx - SCDs, ambulation, aspirin - WBAT operative extremity - Pain control - Discharge planning - home today after PT  Glee ArvinMichael Mase Dhondt 09/29/2017, 7:35 AM [phone]

## 2017-09-30 ENCOUNTER — Encounter (INDEPENDENT_AMBULATORY_CARE_PROVIDER_SITE_OTHER): Payer: Self-pay | Admitting: Orthopaedic Surgery

## 2017-09-30 ENCOUNTER — Other Ambulatory Visit: Payer: Self-pay

## 2017-09-30 ENCOUNTER — Other Ambulatory Visit (HOSPITAL_COMMUNITY): Payer: Self-pay

## 2017-09-30 MED FILL — LISINOPRIL 10 MG TABS: 10 | 90 days supply | Qty: 90 | Fill #1

## 2017-09-30 MED FILL — MONTELUKAST SOD 10 MG TAB: 10 | 90 days supply | Qty: 90 | Fill #1

## 2017-09-30 MED FILL — PRAVASTATIN SODIUM 20 MG TA: 20 | 90 days supply | Qty: 90 | Fill #1

## 2017-09-30 NOTE — Patient Outreach (Signed)
[facility] Care Management  09/30/2017  Colton PizzaJeffrey A Axton Jr. [birth year] [id number]   Telephone call for transition of care call.  Member was hospitalized for rt patellofemoral arthoplasty on 09/28/17 and discharged on 09/29/17. Subjective:  Member states that he has his discharged instructions.  States that he has not been contacted by home health yet and he does not know the number to contact them. States that they have not called to schedule his follow up appt yet as they just got up.  States that his wife is a Engineer, civil (consulting)nurse and she is taking good care of him.  States he knows when to call his MD for infection or pain. Patient was recently discharged from hospital and all medications have been reviewed. Transition of care call completed. Reviewed s/s to call MD Instructed to call surgeon today to schedule follow up appt. RNCM called [facility] to verify that they had referral for member and they will call today. Member given number for [facility] [phone] to contact them if he does not get a call from them today.  Transition of care call completed.  Plan to close case as member has been assessed with no further interventions needed. Successful out reach letter to be sent to member. Dudley MajorMelissa Mailynn Everly RN, [facility],CCM Care Management Coordinator-[facility] Care Management [phone]

## 2017-10-01 DIAGNOSIS — Z471 Aftercare following joint replacement surgery: Secondary | ICD-10-CM | POA: Diagnosis not present

## 2017-10-01 DIAGNOSIS — I10 Essential (primary) hypertension: Secondary | ICD-10-CM | POA: Diagnosis not present

## 2017-10-01 DIAGNOSIS — K760 Fatty (change of) liver, not elsewhere classified: Secondary | ICD-10-CM | POA: Diagnosis not present

## 2017-10-01 DIAGNOSIS — G4730 Sleep apnea, unspecified: Secondary | ICD-10-CM | POA: Diagnosis not present

## 2017-10-01 DIAGNOSIS — F4310 Post-traumatic stress disorder, unspecified: Secondary | ICD-10-CM | POA: Diagnosis not present

## 2017-10-01 DIAGNOSIS — F0630 Mood disorder due to known physiological condition, unspecified: Secondary | ICD-10-CM | POA: Diagnosis not present

## 2017-10-01 DIAGNOSIS — F319 Bipolar disorder, unspecified: Secondary | ICD-10-CM | POA: Diagnosis not present

## 2017-10-01 DIAGNOSIS — H409 Unspecified glaucoma: Secondary | ICD-10-CM | POA: Diagnosis not present

## 2017-10-01 DIAGNOSIS — J449 Chronic obstructive pulmonary disease, unspecified: Secondary | ICD-10-CM | POA: Diagnosis not present

## 2017-10-03 ENCOUNTER — Telehealth (HOSPITAL_COMMUNITY): Payer: Self-pay

## 2017-10-03 ENCOUNTER — Telehealth (INDEPENDENT_AMBULATORY_CARE_PROVIDER_SITE_OTHER): Payer: Self-pay | Admitting: Orthopaedic Surgery

## 2017-10-03 NOTE — Telephone Encounter (Signed)
Medication management - [facility] to inform of pt's approval received for Latuda 80 mg, #90 as Darl PikesSusan, pharmacist reported the co-payment would be $200. Requested pt. call our office back if cannot afford. PA reference # [id number] and approved from 09/29/2017-09/28/2018, plan code [phone].

## 2017-10-03 NOTE — Telephone Encounter (Signed)
Called Colton HesselbachMaria back to advise on message

## 2017-10-03 NOTE — Telephone Encounter (Signed)
yes

## 2017-10-03 NOTE — Telephone Encounter (Signed)
Is this okay?

## 2017-10-03 NOTE — Telephone Encounter (Signed)
Colton Vang from [facility] called requesting VO for the following: ° °1x a week for 1 week °3x a week for 1 week °1x a week for 1 week ° °CB#[phone].  Thank you. °

## 2017-10-03 NOTE — Telephone Encounter (Signed)
We have these new latuda coupons that may work for him to bring copay to $15 - he can pick one up

## 2017-10-04 ENCOUNTER — Telehealth (INDEPENDENT_AMBULATORY_CARE_PROVIDER_SITE_OTHER): Payer: Self-pay | Admitting: Orthopaedic Surgery

## 2017-10-04 DIAGNOSIS — J449 Chronic obstructive pulmonary disease, unspecified: Secondary | ICD-10-CM | POA: Diagnosis not present

## 2017-10-04 DIAGNOSIS — Z471 Aftercare following joint replacement surgery: Secondary | ICD-10-CM | POA: Diagnosis not present

## 2017-10-04 DIAGNOSIS — I10 Essential (primary) hypertension: Secondary | ICD-10-CM | POA: Diagnosis not present

## 2017-10-04 DIAGNOSIS — G4730 Sleep apnea, unspecified: Secondary | ICD-10-CM | POA: Diagnosis not present

## 2017-10-04 DIAGNOSIS — K760 Fatty (change of) liver, not elsewhere classified: Secondary | ICD-10-CM | POA: Diagnosis not present

## 2017-10-04 DIAGNOSIS — F0630 Mood disorder due to known physiological condition, unspecified: Secondary | ICD-10-CM | POA: Diagnosis not present

## 2017-10-04 DIAGNOSIS — F319 Bipolar disorder, unspecified: Secondary | ICD-10-CM | POA: Diagnosis not present

## 2017-10-04 DIAGNOSIS — F4310 Post-traumatic stress disorder, unspecified: Secondary | ICD-10-CM | POA: Diagnosis not present

## 2017-10-04 DIAGNOSIS — H409 Unspecified glaucoma: Secondary | ICD-10-CM | POA: Diagnosis not present

## 2017-10-04 NOTE — Telephone Encounter (Signed)
Please advise 

## 2017-10-04 NOTE — Telephone Encounter (Signed)
Dr. Xu patient. 

## 2017-10-04 NOTE — Telephone Encounter (Signed)
Med refill   oxyxodone (oxycontin)

## 2017-10-04 NOTE — Telephone Encounter (Signed)
#  30.  1 tab po tid prn pain

## 2017-10-05 ENCOUNTER — Other Ambulatory Visit (INDEPENDENT_AMBULATORY_CARE_PROVIDER_SITE_OTHER): Payer: Self-pay

## 2017-10-05 DIAGNOSIS — Z471 Aftercare following joint replacement surgery: Secondary | ICD-10-CM | POA: Diagnosis not present

## 2017-10-05 DIAGNOSIS — I10 Essential (primary) hypertension: Secondary | ICD-10-CM | POA: Diagnosis not present

## 2017-10-05 DIAGNOSIS — F0630 Mood disorder due to known physiological condition, unspecified: Secondary | ICD-10-CM | POA: Diagnosis not present

## 2017-10-05 DIAGNOSIS — H409 Unspecified glaucoma: Secondary | ICD-10-CM | POA: Diagnosis not present

## 2017-10-05 DIAGNOSIS — J449 Chronic obstructive pulmonary disease, unspecified: Secondary | ICD-10-CM | POA: Diagnosis not present

## 2017-10-05 DIAGNOSIS — G4730 Sleep apnea, unspecified: Secondary | ICD-10-CM | POA: Diagnosis not present

## 2017-10-05 DIAGNOSIS — F319 Bipolar disorder, unspecified: Secondary | ICD-10-CM | POA: Diagnosis not present

## 2017-10-05 DIAGNOSIS — F4310 Post-traumatic stress disorder, unspecified: Secondary | ICD-10-CM | POA: Diagnosis not present

## 2017-10-05 DIAGNOSIS — K760 Fatty (change of) liver, not elsewhere classified: Secondary | ICD-10-CM | POA: Diagnosis not present

## 2017-10-05 MED ORDER — OXYCODONE-ACETAMINOPHEN 5-325 MG PO TABS: ORAL_TABLET | ORAL | 0 refills | Status: DC

## 2017-10-05 NOTE — Telephone Encounter (Signed)
Oxycodone 5MG

## 2017-10-05 NOTE — Telephone Encounter (Signed)
yes

## 2017-10-05 NOTE — Telephone Encounter (Signed)
Rx printed, Pending signature.

## 2017-10-06 MED FILL — LATUDA 80 MG TABLET: 80 | 30 days supply | Qty: 30 | Fill #0

## 2017-10-06 MED FILL — OXYCODONE-ACETAMINOPHEN 5-3: 5-325 | 10 days supply | Qty: 30 | Fill #0

## 2017-10-06 NOTE — Telephone Encounter (Signed)
Called pt to let him know Rx ready for pick up at the front desk.  

## 2017-10-07 DIAGNOSIS — I10 Essential (primary) hypertension: Secondary | ICD-10-CM | POA: Diagnosis not present

## 2017-10-07 DIAGNOSIS — F4310 Post-traumatic stress disorder, unspecified: Secondary | ICD-10-CM | POA: Diagnosis not present

## 2017-10-07 DIAGNOSIS — K760 Fatty (change of) liver, not elsewhere classified: Secondary | ICD-10-CM | POA: Diagnosis not present

## 2017-10-07 DIAGNOSIS — G4730 Sleep apnea, unspecified: Secondary | ICD-10-CM | POA: Diagnosis not present

## 2017-10-07 DIAGNOSIS — F319 Bipolar disorder, unspecified: Secondary | ICD-10-CM | POA: Diagnosis not present

## 2017-10-07 DIAGNOSIS — Z471 Aftercare following joint replacement surgery: Secondary | ICD-10-CM | POA: Diagnosis not present

## 2017-10-07 DIAGNOSIS — F0630 Mood disorder due to known physiological condition, unspecified: Secondary | ICD-10-CM | POA: Diagnosis not present

## 2017-10-07 DIAGNOSIS — H409 Unspecified glaucoma: Secondary | ICD-10-CM | POA: Diagnosis not present

## 2017-10-07 DIAGNOSIS — J449 Chronic obstructive pulmonary disease, unspecified: Secondary | ICD-10-CM | POA: Diagnosis not present

## 2017-10-09 DIAGNOSIS — F319 Bipolar disorder, unspecified: Secondary | ICD-10-CM | POA: Diagnosis not present

## 2017-10-09 DIAGNOSIS — I10 Essential (primary) hypertension: Secondary | ICD-10-CM | POA: Diagnosis not present

## 2017-10-09 DIAGNOSIS — J449 Chronic obstructive pulmonary disease, unspecified: Secondary | ICD-10-CM | POA: Diagnosis not present

## 2017-10-09 DIAGNOSIS — F0630 Mood disorder due to known physiological condition, unspecified: Secondary | ICD-10-CM | POA: Diagnosis not present

## 2017-10-09 DIAGNOSIS — G4730 Sleep apnea, unspecified: Secondary | ICD-10-CM | POA: Diagnosis not present

## 2017-10-09 DIAGNOSIS — K760 Fatty (change of) liver, not elsewhere classified: Secondary | ICD-10-CM | POA: Diagnosis not present

## 2017-10-09 DIAGNOSIS — Z471 Aftercare following joint replacement surgery: Secondary | ICD-10-CM | POA: Diagnosis not present

## 2017-10-09 DIAGNOSIS — F4310 Post-traumatic stress disorder, unspecified: Secondary | ICD-10-CM | POA: Diagnosis not present

## 2017-10-09 DIAGNOSIS — H409 Unspecified glaucoma: Secondary | ICD-10-CM | POA: Diagnosis not present

## 2017-10-13 ENCOUNTER — Inpatient Hospital Stay (INDEPENDENT_AMBULATORY_CARE_PROVIDER_SITE_OTHER): Payer: 59 | Admitting: Orthopaedic Surgery

## 2017-10-17 ENCOUNTER — Encounter (INDEPENDENT_AMBULATORY_CARE_PROVIDER_SITE_OTHER): Payer: Self-pay | Admitting: Orthopaedic Surgery

## 2017-10-17 ENCOUNTER — Ambulatory Visit (INDEPENDENT_AMBULATORY_CARE_PROVIDER_SITE_OTHER): Payer: 59 | Admitting: Orthopaedic Surgery

## 2017-10-17 DIAGNOSIS — Z96651 Presence of right artificial knee joint: Secondary | ICD-10-CM

## 2017-10-17 NOTE — Progress Notes (Signed)
Office Visit Note   Patient: Colton PizzaJeffrey A Casaus Jr.           Date of Birth: 07-08-1976           MRN: [id number] Visit Date: 10/17/2017              Requested by: Shirline FreesNafziger, Cory, NP [address] [place], [place] [id number] PCP: Shirline FreesNafziger, Cory, NP   Assessment & Plan: Visit Diagnoses:  1. Status post right partial knee replacement     Plan: At this point and pleased with just progress.  We will go ahead and start him in outpatient physical therapy.  A prescription was given for this.  He does not need a refill on pain medication.  He does not need a work note as he is disabled.  He will follow-up with us in 4 weeks time for repeat evaluation and x-ray.  Follow-Up Instructions: Return in about 4 weeks (around 11/14/2017).   Orders:  No orders of the defined types were placed in this encounter.  No orders of the defined types were placed in this encounter.     Procedures: No procedures performed   Clinical Data: No additional findings.   Subjective: Chief Complaint  Patient presents with  . Right Knee - Pain    HPI Colton Vang comes in for follow-up.  2 weeks out right knee patellofemoral arthroplasty date of surgery 09/28/2017.  He has been working with home health PT and has made great progress.  He is taking occasional oxycodone for pain.  No fevers chills or any other systemic symptoms.  Review of Systems as detailed in HPI all others reviewed and are negative.   Objective: Vital Signs: There were no vitals taken for this visit.  Physical Exam well-developed well-nourished gentleman in no acute distress.  Alert and oriented x3.  Ortho Exam examination of the right knee reveals well-healing surgical incision without evidence of infection.  1+ effusion.  No calf tenderness.  Range of motion 0-95 degrees.  He is neurovascular intact distally.  Specialty Comments:  No specialty comments available.  Imaging: No results found.   PMFS History: Patient Active  Problem List   Diagnosis Date Noted  . Status post right partial knee replacement 09/28/2017  . PTSD (post-traumatic stress disorder) 08/30/2017  . Mood disorder in conditions classified elsewhere 08/30/2017  . Hypothyroidism 08/12/2017  . Fatty liver disease, nonalcoholic 08/12/2017  . Essential hypertension 06/28/2017  . Glaucoma of both eyes 06/28/2017  . Hyperlipidemia 06/28/2017  . Medication refill 06/28/2017  . Chronic obstructive pulmonary disease (HCC) 06/28/2017  . Bipolar 1 disorder, depressed (HCC) 06/28/2017   Past Medical History:  Diagnosis Date  . Alcoholism (HCC)   . Arthritis   . Asthma   . Bipolar 1 disorder (HCC)   . COPD (chronic obstructive pulmonary disease) (HCC)   . Depression   . DJD (degenerative joint disease)   . Family history of adverse reaction to anesthesia    grandfather was difficult to wake up  . Fatty liver   . GERD (gastroesophageal reflux disease)   . Glaucoma   . Headache   . Hypertension   . Hypothyroidism   . Major depressive disorder   . Sleep apnea    wears CPAP #7    Family History  Problem Relation Age of Onset  . Arthritis Mother   . Alcohol abuse Father   . Arthritis Father   . Hyperlipidemia Father   . Hypertension Father   . Glaucoma Father   .  Alcohol abuse Maternal Grandfather   . Alcohol abuse Paternal Grandfather   . Arthritis Paternal Grandfather   . Hyperlipidemia Paternal Grandfather   . Heart disease Paternal Grandfather   . Hypertension Paternal Grandfather   . Diabetes Other     Past Surgical History:  Procedure Laterality Date  . Cervical Disk replacement     . GALLBLADDER SURGERY  2006  . HERNIA REPAIR    . left ankle surgery     . TOTAL KNEE ARTHROPLASTY Right 09/28/2017   Procedure: Right patellofemoral arthroplasty;  Surgeon: Tarry KosXu, Byren Pankow M, MD;  [facility];  Service: Orthopedics;  Laterality: Right;  Marland Kitchen. VASECTOMY     Social History   Occupational History  . Not on file  Tobacco Use    . Smoking status: Current Every Day Smoker    Packs/day: 0.50    Types: Cigarettes  . Smokeless tobacco: Former NeurosurgeonUser    Types: Snuff  Substance and Sexual Activity  . Alcohol use: No  . Drug use: No  . Sexual activity: Yes    Partners: Female    Birth control/protection: None

## 2017-10-18 NOTE — Telephone Encounter (Signed)
Yes #30.  1-2 tabs bid prn pain

## 2017-10-19 ENCOUNTER — Other Ambulatory Visit (INDEPENDENT_AMBULATORY_CARE_PROVIDER_SITE_OTHER): Payer: Self-pay

## 2017-10-19 MED ORDER — OXYCODONE-ACETAMINOPHEN 5-325 MG PO TABS: ORAL_TABLET | ORAL | 0 refills | Status: DC

## 2017-10-19 MED FILL — OXYCODONE-ACETAMINOPHEN 5-3: 5-325 | 8 days supply | Qty: 30 | Fill #0

## 2017-10-25 ENCOUNTER — Encounter (HOSPITAL_COMMUNITY): Payer: Self-pay | Admitting: Orthopaedic Surgery

## 2017-10-25 DIAGNOSIS — M6281 Muscle weakness (generalized): Secondary | ICD-10-CM | POA: Diagnosis not present

## 2017-10-25 DIAGNOSIS — M25561 Pain in right knee: Secondary | ICD-10-CM | POA: Diagnosis not present

## 2017-10-25 DIAGNOSIS — M25661 Stiffness of right knee, not elsewhere classified: Secondary | ICD-10-CM | POA: Diagnosis not present

## 2017-11-14 ENCOUNTER — Ambulatory Visit (INDEPENDENT_AMBULATORY_CARE_PROVIDER_SITE_OTHER): Payer: 59 | Admitting: Orthopaedic Surgery

## 2017-11-14 ENCOUNTER — Telehealth (HOSPITAL_COMMUNITY): Payer: Self-pay

## 2017-11-14 NOTE — Telephone Encounter (Signed)
Okay, I called the patient and let him know, there are samples at the front desk, patient will pick up today.

## 2017-11-14 NOTE — Telephone Encounter (Signed)
He is on a high dose of latuda, and stable for years.  I have met him once, and cannot safely make a medication change over the phone.  Switching from this is not an overnight process, we can discuss at his follow-up.  Do we have some 80 mg samples we can provide in the meantime?

## 2017-11-14 NOTE — Telephone Encounter (Signed)
Patient was recently prescribed Latuda, the co-pay, even with the card, is 200 dollars. I called pharmacy and they said it was due to his high deductible plan. Patient would like something different and he also would like something to help with sleep. Please review and advise, thank you

## 2017-11-15 ENCOUNTER — Ambulatory Visit (INDEPENDENT_AMBULATORY_CARE_PROVIDER_SITE_OTHER): Payer: 59

## 2017-11-15 ENCOUNTER — Encounter (INDEPENDENT_AMBULATORY_CARE_PROVIDER_SITE_OTHER): Payer: Self-pay | Admitting: Orthopaedic Surgery

## 2017-11-15 ENCOUNTER — Ambulatory Visit (INDEPENDENT_AMBULATORY_CARE_PROVIDER_SITE_OTHER): Payer: 59 | Admitting: Orthopaedic Surgery

## 2017-11-15 DIAGNOSIS — G8929 Other chronic pain: Secondary | ICD-10-CM | POA: Diagnosis not present

## 2017-11-15 DIAGNOSIS — M25561 Pain in right knee: Secondary | ICD-10-CM

## 2017-11-15 DIAGNOSIS — Z96651 Presence of right artificial knee joint: Secondary | ICD-10-CM

## 2017-11-15 NOTE — Progress Notes (Signed)
Patient is 6 weeks status post right patellofemoral arthroplasty.  He is doing very well overall.  He is doing home exercise.  He has no pain going up stairs and has some slight pain coming down stairs.  Overall he is very happy.  His surgical scar is healed.  He has excellent range of motion.  He walks without any pain.  I would like to see him back in 6 weeks for recheck.  No x-rays needed.  His left knee is not bothering him at this point and he is doing okay from that standpoint.

## 2017-11-23 MED FILL — COLESTIPOL HCL 1 GM TABLET: 1 | 90 days supply | Qty: 180 | Fill #0

## 2017-11-23 MED FILL — BREO ELLIPTA 100-25 MCG INH: 100-25 | 30 days supply | Qty: 60 | Fill #3

## 2017-11-29 ENCOUNTER — Encounter (HOSPITAL_COMMUNITY): Payer: Self-pay | Admitting: Psychiatry

## 2017-11-29 ENCOUNTER — Ambulatory Visit (INDEPENDENT_AMBULATORY_CARE_PROVIDER_SITE_OTHER): Payer: 59 | Admitting: Psychiatry

## 2017-11-29 VITALS — BP 132/80 | HR 119 | Ht 69.0 in | Wt 261.4 lb

## 2017-11-29 DIAGNOSIS — Z811 Family history of alcohol abuse and dependence: Secondary | ICD-10-CM | POA: Diagnosis not present

## 2017-11-29 DIAGNOSIS — Z87891 Personal history of nicotine dependence: Secondary | ICD-10-CM | POA: Diagnosis not present

## 2017-11-29 DIAGNOSIS — Z760 Encounter for issue of repeat prescription: Secondary | ICD-10-CM

## 2017-11-29 DIAGNOSIS — F319 Bipolar disorder, unspecified: Secondary | ICD-10-CM | POA: Diagnosis not present

## 2017-11-29 DIAGNOSIS — F4312 Post-traumatic stress disorder, chronic: Secondary | ICD-10-CM

## 2017-11-29 DIAGNOSIS — Z79899 Other long term (current) drug therapy: Secondary | ICD-10-CM

## 2017-11-29 MED ORDER — LORAZEPAM 1 MG PO TABS: 1.0000 mg | ORAL_TABLET | Freq: Two times a day (BID) | ORAL | 0 refills | Status: DC | PRN

## 2017-11-29 MED ORDER — LURASIDONE HCL 80 MG PO TABS: 80.0000 mg | ORAL_TABLET | Freq: Every evening | ORAL | 1 refills | Status: AC

## 2017-11-29 MED ORDER — ESCITALOPRAM OXALATE 20 MG PO TABS: 20.0000 mg | ORAL_TABLET | ORAL | 1 refills | Status: DC

## 2017-11-29 MED ORDER — AMITRIPTYLINE HCL 100 MG PO TABS: 100.0000 mg | ORAL_TABLET | Freq: Every day | ORAL | 1 refills | Status: DC

## 2017-11-29 MED FILL — LATUDA 80 MG TABLET: 80 | 90 days supply | Qty: 90 | Fill #0

## 2017-11-29 MED FILL — LORazepam 1 MG TABS: 1 | 30 days supply | Qty: 60 | Fill #0

## 2017-11-29 NOTE — Progress Notes (Signed)
BH MD/PA/NP OP Progress Note  11/29/2017 11:27 AM Colton Vang.  MRN:  [id number]  Chief Complaint: Med management HPI: Patient reports that his nightmares are dramatically improved with amitriptyline.  His mood has remained stable on Lexapro, Latuda, Elavil.  He uses Ativan periodically for anxiety or panic.  He reports that prazosin was not tolerated, and made his heart rate go up.  We agreed to continue the current medication regimen as he feels like his mood is stable.  We will follow-up in 3 months or sooner if needed.   Of particular concern, the Kasandra Knudsen has been quite expensive with co-pay.  I provided him with an updated co-pay card that may be able to reduce the cost.  If this is an issue, we may ultimately switch to [facility] for bipolar maintenance therapy.  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  Visit Diagnosis:    ICD-10-CM   1. Chronic post-traumatic stress disorder (PTSD) F43.12   2. Bipolar 1 disorder, depressed (HCC) F31.9 lurasidone (LATUDA) 80 MG TABS tablet    escitalopram (LEXAPRO) 20 MG tablet    LORazepam (ATIVAN) 1 MG tablet  3. Medication refill Z76.0 amitriptyline (ELAVIL) 100 MG tablet    Past Psychiatric History: See intake H&P for full details. Reviewed, with no updates at this time.   Past Medical History:  Past Medical History:  Diagnosis Date  . Alcoholism (HCC)   . Arthritis   . Asthma   . Bipolar 1 disorder (HCC)   . COPD (chronic obstructive pulmonary disease) (HCC)   . Depression   . DJD (degenerative joint disease)   . Family history of adverse reaction to anesthesia    grandfather was difficult to wake up  . Fatty liver   . GERD (gastroesophageal reflux disease)   . Glaucoma   . Headache   . Hypertension   . Hypothyroidism   . Major depressive disorder   . Sleep apnea    wears CPAP #7    Past Surgical History:  Procedure Laterality Date  . Cervical Disk replacement     . GALLBLADDER SURGERY   2006  . HERNIA REPAIR    . left ankle surgery     . TOTAL KNEE ARTHROPLASTY Right 09/28/2017   Procedure: Right patellofemoral arthroplasty;  Surgeon: Tarry Kos, MD;  Location: [facility] OR;  Service: Orthopedics;  Laterality: Right;  Marland Kitchen VASECTOMY      Family Psychiatric History: See intake H&P for full details. Reviewed, with no updates at this time.   Family History:  Family History  Problem Relation Age of Onset  . Arthritis Mother   . Alcohol abuse Father   . Arthritis Father   . Hyperlipidemia Father   . Hypertension Father   . Glaucoma Father   . Alcohol abuse Maternal Grandfather   . Alcohol abuse Paternal Grandfather   . Arthritis Paternal Grandfather   . Hyperlipidemia Paternal Grandfather   . Heart disease Paternal Grandfather   . Hypertension Paternal Grandfather   . Diabetes Other     Social History:  Social History   Socioeconomic History  . Marital status: Married    Spouse name: None  . Number of children: None  . Years of education: None  . Highest education level: None  Social Needs  . Financial resource strain: None  . Food insecurity - worry: None  . Food insecurity - inability: None  . Transportation needs - medical: None  . Transportation  needs - non-medical: None  Occupational History  . None  Tobacco Use  . Smoking status: Former Smoker    Packs/day: 0.50    Types: Cigarettes  . Smokeless tobacco: Former NeurosurgeonUser    Types: Snuff  Substance and Sexual Activity  . Alcohol use: No  . Drug use: No  . Sexual activity: Yes    Partners: Female    Birth control/protection: None  Other Topics Concern  . None  Social History Narrative   He is on disability    Two adopted children and one biological     Allergies:  Allergies  Allergen Reactions  . Hydrocodone Itching    Metabolic Disorder Labs: Lab Results  Component Value Date   HGBA1C 6.0 08/12/2017   No results found for: PROLACTIN Lab Results  Component Value Date   CHOL 206 (H)  08/12/2017   TRIG 151.0 (H) 08/12/2017   HDL 51.00 08/12/2017   CHOLHDL 4 08/12/2017   VLDL 30.2 08/12/2017   LDLCALC 125 (H) 08/12/2017   Lab Results  Component Value Date   TSH 3.26 08/12/2017    Therapeutic Level Labs: No results found for: LITHIUM No results found for: VALPROATE No components found for:  CBMZ  Current Medications: Current Outpatient Medications  Medication Sig Dispense Refill  . albuterol (PROVENTIL HFA;VENTOLIN HFA) 108 (90 Base) MCG/ACT inhaler Inhale 2 puffs into the lungs every 4 (four) hours as needed for wheezing or shortness of breath. 18 g 3  . amitriptyline (ELAVIL) 100 MG tablet Take 1 tablet (100 mg total) by mouth at bedtime. 90 tablet 1  . aspirin EC 325 MG tablet Take 1 tablet (325 mg total) by mouth 2 (two) times daily. 84 tablet 0  . cetirizine (ZYRTEC) 10 MG chewable tablet Chew 1 tablet (10 mg total) by mouth daily. 90 tablet 1  . cetirizine (ZYRTEC) 10 MG tablet Take 10 mg by mouth daily.    . colestipol (COLESTID) 1 g tablet Take 1 tablet (1 g total) by mouth 2 (two) times daily. 180 tablet 1  . diclofenac sodium (VOLTAREN) 1 % GEL Apply 2 g topically 4 (four) times daily. 1 Tube 5  . escitalopram (LEXAPRO) 20 MG tablet Take 1 tablet (20 mg total) by mouth every morning. 90 tablet 1  . fluticasone furoate-vilanterol (BREO ELLIPTA) 100-25 MCG/INH AEPB Inhale 1 puff into the lungs daily. 28 each 3  . lansoprazole (PREVACID SOLUTAB) 30 MG disintegrating tablet Take 30 mg by mouth 2 (two) times daily as needed.    Marland Kitchen. lisinopril (PRINIVIL,ZESTRIL) 10 MG tablet Take 1 tablet (10 mg total) by mouth daily. 90 tablet 1  . LORazepam (ATIVAN) 1 MG tablet Take 1 tablet (1 mg total) by mouth 2 (two) times daily as needed for anxiety (panic). 60 tablet 0  . lurasidone (LATUDA) 80 MG TABS tablet Take 1 tablet (80 mg total) by mouth every evening. 90 tablet 1  . mometasone (NASONEX) 50 MCG/ACT nasal spray Place 2 sprays into the nose daily. 17 g 6  .  montelukast (SINGULAIR) 10 MG tablet Take 1 tablet (10 mg total) by mouth at bedtime. 90 tablet 1  . ondansetron (ZOFRAN) 4 MG tablet Take 1-2 tablets (4-8 mg total) by mouth every 8 (eight) hours as needed for nausea or vomiting. 40 tablet 0  . pravastatin (PRAVACHOL) 20 MG tablet Take 1 tablet (20 mg total) by mouth at bedtime. 90 tablet 1  . promethazine (PHENERGAN) 25 MG tablet Take 1 tablet (25 mg total) by  mouth every 6 (six) hours as needed for nausea. 30 tablet 1  . ranitidine (ZANTAC) 150 MG tablet Take 1 tablet (150 mg total) by mouth 2 (two) times daily. 180 tablet 1  . senna-docusate (SENOKOT S) 8.6-50 MG tablet Take 1 tablet by mouth at bedtime as needed. 30 tablet 1  . tiZANidine (ZANAFLEX) 4 MG tablet Take 1 tablet (4 mg total) by mouth every 6 (six) hours as needed for muscle spasms. 30 tablet 2   No current facility-administered medications for this visit.      Musculoskeletal: Strength & Muscle Tone: within normal limits Gait & Station: normal Patient leans: N/A  Psychiatric Specialty Exam: ROS  Blood pressure 132/80, pulse (!) 119, height 5\' 9"  (1.753 m), weight 261 lb 6.4 oz (118.6 kg).Body mass index is 38.6 kg/m.  General Appearance: Casual and Fairly Groomed  Eye Contact:  Good  Speech:  Clear and Coherent and Normal Rate  Volume:  Normal  Mood:  Euthymic  Affect:  Appropriate and Congruent  Thought Process:  Goal Directed and Descriptions of Associations: Intact  Orientation:  Full (Time, Place, and Person)  Thought Content: Logical   Suicidal Thoughts:  No  Homicidal Thoughts:  No  Memory:  Immediate;   Good  Judgement:  Intact  Insight:  Good  Psychomotor Activity:  Normal  Concentration:  Concentration: Good  Recall:  Good  Fund of Knowledge: Good  Language: Good  Akathisia:  Negative  Handed:  Right  AIMS (if indicated): 0  Assets:  Communication Skills Desire for Improvement Financial Resources/Insurance Housing Intimacy Leisure  Time Physical Health Resilience Social Support Talents/Skills Transportation  ADL's:  Intact  Cognition: WNL  Sleep:  Good   Screenings:   Assessment and Plan:  Colton Vang. presents as euthymic on the current medication regimen.  Recent stressors have been knee surgery and the ensuing recovery, and supporting his wife during her recovery from back surgery.  He had a fairly good holiday, spent time with family.  Reports that his sleep has been better with the increased dose of Elavil.  No acute safety issues, we will follow-up in 3 months or sooner if needed.  1. Chronic post-traumatic stress disorder (PTSD)   2. Bipolar 1 disorder, depressed (HCC)   3. Medication refill     Status of current problems: stable  Labs Ordered: No orders of the defined types were placed in this encounter.   Labs Reviewed: n/a  Collateral Obtained/Records Reviewed: n/a  Plan:  Continue Latuda 80 mg daily Continue Lexapro 20 mg daily Continue amitriptyline 100 mg nightly Ativan 1 mg twice a day as needed for anxiety or panic Return to clinic in 3 months  I spent 20 minutes with the patient in direct face-to-face clinical care.  Greater than 50% of this time was spent in counseling and coordination of care with the patient.    Burnard Leigh, MD 11/29/2017, 11:27 AM

## 2017-12-07 ENCOUNTER — Encounter: Payer: Self-pay | Admitting: Adult Health

## 2017-12-07 ENCOUNTER — Ambulatory Visit (INDEPENDENT_AMBULATORY_CARE_PROVIDER_SITE_OTHER): Payer: 59 | Admitting: Adult Health

## 2017-12-07 VITALS — BP 130/92 | HR 117 | Temp 98.4°F | Wt 260.0 lb

## 2017-12-07 DIAGNOSIS — R000 Tachycardia, unspecified: Secondary | ICD-10-CM | POA: Diagnosis not present

## 2017-12-07 MED ORDER — PROPRANOLOL HCL ER 60 MG PO CP24: 60.0000 mg | ORAL_CAPSULE | Freq: Every day | ORAL | 1 refills | Status: DC

## 2017-12-07 MED FILL — PROPRANOLOL HCL ER 60 MG CP: 60 | 30 days supply | Qty: 30 | Fill #0

## 2017-12-07 NOTE — Progress Notes (Signed)
Subjective:    Patient ID: Colton Pizza., male    DOB: [birth year], 42 y.o.   MRN: [id number]  HPI  Patient presents for fast heartbeat.  He had his Right knee replaced in December of 2018 and since then they have noticed a fast heart beat when he has been seen in orthopedics or at his psychiatrist's office.  He states that at rest his heart rate is 120 and has been as high as 140.  The lowest he has noticed is 90 on his smartwatch.  He has no palpitations, chest pain or shortness of breath any worse than his usual dyspnea from his COPD.  He is not taking any OTC medications or using any recreational drugs.  When his heart rate is 140 he does experience dizziness and heavy heart beat that feels like his heart is beating out of chest.  He does drink 3- 20 ounce a day.  Resting heart rate today is 116. Cardiac work-up in the past with negative stress test.  EKG's in the past reveal sinus tachycardia without abnormality.    Review of Systems  Constitutional: Negative.   HENT: Negative.   Respiratory: Positive for shortness of breath. Negative for cough, chest tightness and wheezing.   Cardiovascular: Negative for chest pain, palpitations and leg swelling.  Gastrointestinal: Negative.   Neurological: Positive for dizziness. Negative for speech difficulty, weakness, light-headedness, numbness and headaches.   Past Medical History:  Diagnosis Date  . Alcoholism (HCC)   . Arthritis   . Asthma   . Bipolar 1 disorder (HCC)   . COPD (chronic obstructive pulmonary disease) (HCC)   . Depression   . DJD (degenerative joint disease)   . Family history of adverse reaction to anesthesia    grandfather was difficult to wake up  . Fatty liver   . GERD (gastroesophageal reflux disease)   . Glaucoma   . Headache   . Hypertension   . Hypothyroidism   . Major depressive disorder   . Sleep apnea    wears CPAP #7    Social History   Socioeconomic History  . Marital status: Married    Spouse  name: Not on file  . Number of children: Not on file  . Years of education: Not on file  . Highest education level: Not on file  Social Needs  . Financial resource strain: Not on file  . Food insecurity - worry: Not on file  . Food insecurity - inability: Not on file  . Transportation needs - medical: Not on file  . Transportation needs - non-medical: Not on file  Occupational History  . Not on file  Tobacco Use  . Smoking status: Former Smoker    Packs/day: 0.50    Types: Cigarettes  . Smokeless tobacco: Former Neurosurgeon    Types: Snuff  Substance and Sexual Activity  . Alcohol use: No  . Drug use: No  . Sexual activity: Yes    Partners: Female    Birth control/protection: None  Other Topics Concern  . Not on file  Social History Narrative   He is on disability    Two adopted children and one biological     Past Surgical History:  Procedure Laterality Date  . Cervical Disk replacement     . GALLBLADDER SURGERY  2006  . HERNIA REPAIR    . left ankle surgery     . TOTAL KNEE ARTHROPLASTY Right 09/28/2017   Procedure: Right patellofemoral arthroplasty;  Surgeon: Gershon Mussel  M, MD;  Location: [facility] OR;  Service: Orthopedics;  Laterality: Right;  Marland Kitchen. VASECTOMY      Family History  Problem Relation Age of Onset  . Arthritis Mother   . Alcohol abuse Father   . Arthritis Father   . Hyperlipidemia Father   . Hypertension Father   . Glaucoma Father   . Alcohol abuse Maternal Grandfather   . Alcohol abuse Paternal Grandfather   . Arthritis Paternal Grandfather   . Hyperlipidemia Paternal Grandfather   . Heart disease Paternal Grandfather   . Hypertension Paternal Grandfather   . Diabetes Other     Allergies  Allergen Reactions  . Hydrocodone Itching    Current Outpatient Medications on File Prior to Visit  Medication Sig Dispense Refill  . albuterol (PROVENTIL HFA;VENTOLIN HFA) 108 (90 Base) MCG/ACT inhaler Inhale 2 puffs into the lungs every 4 (four) hours as needed for  wheezing or shortness of breath. 18 g 3  . amitriptyline (ELAVIL) 100 MG tablet Take 1 tablet (100 mg total) by mouth at bedtime. 90 tablet 1  . aspirin EC 325 MG tablet Take 1 tablet (325 mg total) by mouth 2 (two) times daily. 84 tablet 0  . cetirizine (ZYRTEC) 10 MG tablet Take 10 mg by mouth daily.    . colestipol (COLESTID) 1 g tablet Take 1 tablet (1 g total) by mouth 2 (two) times daily. 180 tablet 1  . diclofenac sodium (VOLTAREN) 1 % GEL Apply 2 g topically 4 (four) times daily. 1 Tube 5  . escitalopram (LEXAPRO) 20 MG tablet Take 1 tablet (20 mg total) by mouth every morning. 90 tablet 1  . fluticasone furoate-vilanterol (BREO ELLIPTA) 100-25 MCG/INH AEPB Inhale 1 puff into the lungs daily. 28 each 3  . lansoprazole (PREVACID SOLUTAB) 30 MG disintegrating tablet Take 30 mg by mouth 2 (two) times daily as needed.    Marland Kitchen. lisinopril (PRINIVIL,ZESTRIL) 10 MG tablet Take 1 tablet (10 mg total) by mouth daily. 90 tablet 1  . LORazepam (ATIVAN) 1 MG tablet Take 1 tablet (1 mg total) by mouth 2 (two) times daily as needed for anxiety (panic). 60 tablet 0  . lurasidone (LATUDA) 80 MG TABS tablet Take 1 tablet (80 mg total) by mouth every evening. 90 tablet 1  . mometasone (NASONEX) 50 MCG/ACT nasal spray Place 2 sprays into the nose daily. 17 g 6  . montelukast (SINGULAIR) 10 MG tablet Take 1 tablet (10 mg total) by mouth at bedtime. 90 tablet 1  . pravastatin (PRAVACHOL) 20 MG tablet Take 1 tablet (20 mg total) by mouth at bedtime. 90 tablet 1  . senna-docusate (SENOKOT S) 8.6-50 MG tablet Take 1 tablet by mouth at bedtime as needed. 30 tablet 1   No current facility-administered medications on file prior to visit.     BP (!) 130/92   Pulse (!) 117   Temp 98.4 F (36.9 C) (Oral)   Wt 260 lb (117.9 kg)   SpO2 98%   BMI 38.40 kg/m      Objective:   Physical Exam  Constitutional: He is oriented to person, place, and time. He appears well-developed and well-nourished. No distress.    Cardiovascular: Regular rhythm, normal heart sounds and intact distal pulses. Tachycardia present. Exam reveals no gallop and no friction rub.  No murmur heard. Pulmonary/Chest: Effort normal. No respiratory distress. He has wheezes in the right upper field and the right middle field. He has no rales.  Musculoskeletal: Normal range of motion. He exhibits no  edema.  Neurological: He is alert and oriented to person, place, and time.  Skin: Skin is warm and dry. He is not diaphoretic.  Psychiatric: He has a normal mood and affect. His behavior is normal. Judgment and thought content normal.  Nursing note and vitals reviewed.     Assessment & Plan:  1. Tachycardia Take medication as directed.  Continue to monitor heart rate and notify office of daily results next week.  Notify office if you experience any dizziness or syncope after starting this medication.  - propranolol ER (INDERAL LA) 60 MG 24 hr capsule; Take 1 capsule (60 mg total) by mouth daily.  Dispense: 30 capsule; Refill: 1  Tracee Mccreery C Charlotte Fidalgo BSN RN

## 2017-12-07 NOTE — Progress Notes (Signed)
Subjective:    Patient ID: Colton PizzaJeffrey A Pompa Jr., male    DOB: August 05, 1976, 42 y.o.   MRN: [id number]  HPI  42 year old male who  has a past medical history of Alcoholism (HCC), Arthritis, Asthma, Bipolar 1 disorder (HCC), COPD (chronic obstructive pulmonary disease) (HCC), Depression, DJD (degenerative joint disease), Family history of adverse reaction to anesthesia, Fatty liver, GERD (gastroesophageal reflux disease), Glaucoma, Headache, Hypertension, Hypothyroidism, Major depressive disorder, and Sleep apnea.  He presents to the office today for concern of elevated heart rate. [facility] at which time it was reported that prazosin was not tolerated and it caused his heart rate to go up. At this visit his heart rate was 119. His wife has purchased an apple watch that he can keep track of his heart rate.  Ports that his heart rates have been running between 90 and 120 rest and up to 140 with exertion he reports that when his heart rate gets up to 140 he becomes dizzy.  Denies any syncopal episodes.  Is drinking approximately 320 ounce Mountain Dew's per day which is the norm for him  Pulse Readings from Last 3 Encounters:  12/07/17 (!) 117  11/29/17 (!) 119  09/29/17 100     Review of Systems  See HPI  Past Medical History:  Diagnosis Date  . Alcoholism (HCC)   . Arthritis   . Asthma   . Bipolar 1 disorder (HCC)   . COPD (chronic obstructive pulmonary disease) (HCC)   . Depression   . DJD (degenerative joint disease)   . Family history of adverse reaction to anesthesia    grandfather was difficult to wake up  . Fatty liver   . GERD (gastroesophageal reflux disease)   . Glaucoma   . Headache   . Hypertension   . Hypothyroidism   . Major depressive disorder   . Sleep apnea    wears CPAP #7    Social History   Socioeconomic History  . Marital status: Married    Spouse name: Not on file  . Number of children: Not on file  . Years of education:  Not on file  . Highest education level: Not on file  Social Needs  . Financial resource strain: Not on file  . Food insecurity - worry: Not on file  . Food insecurity - inability: Not on file  . Transportation needs - medical: Not on file  . Transportation needs - non-medical: Not on file  Occupational History  . Not on file  Tobacco Use  . Smoking status: Former Smoker    Packs/day: 0.50    Types: Cigarettes  . Smokeless tobacco: Former NeurosurgeonUser    Types: Snuff  Substance and Sexual Activity  . Alcohol use: No  . Drug use: No  . Sexual activity: Yes    Partners: Female    Birth control/protection: None  Other Topics Concern  . Not on file  Social History Narrative   He is on disability    Two adopted children and one biological     Past Surgical History:  Procedure Laterality Date  . Cervical Disk replacement     . GALLBLADDER SURGERY  2006  . HERNIA REPAIR    . left ankle surgery     . TOTAL KNEE ARTHROPLASTY Right 09/28/2017   Procedure: Right patellofemoral arthroplasty;  Surgeon: Tarry KosXu, Naiping M, MD;  [facility];  Service: Orthopedics;  Laterality: Right;  Marland Kitchen. VASECTOMY  Family History  Problem Relation Age of Onset  . Arthritis Mother   . Alcohol abuse Father   . Arthritis Father   . Hyperlipidemia Father   . Hypertension Father   . Glaucoma Father   . Alcohol abuse Maternal Grandfather   . Alcohol abuse Paternal Grandfather   . Arthritis Paternal Grandfather   . Hyperlipidemia Paternal Grandfather   . Heart disease Paternal Grandfather   . Hypertension Paternal Grandfather   . Diabetes Other     Allergies  Allergen Reactions  . Hydrocodone Itching    Current Outpatient Medications on File Prior to Visit  Medication Sig Dispense Refill  . albuterol (PROVENTIL HFA;VENTOLIN HFA) 108 (90 Base) MCG/ACT inhaler Inhale 2 puffs into the lungs every 4 (four) hours as needed for wheezing or shortness of breath. 18 g 3  . amitriptyline (ELAVIL) 100 MG  tablet Take 1 tablet (100 mg total) by mouth at bedtime. 90 tablet 1  . aspirin EC 325 MG tablet Take 1 tablet (325 mg total) by mouth 2 (two) times daily. 84 tablet 0  . cetirizine (ZYRTEC) 10 MG tablet Take 10 mg by mouth daily.    . colestipol (COLESTID) 1 g tablet Take 1 tablet (1 g total) by mouth 2 (two) times daily. 180 tablet 1  . diclofenac sodium (VOLTAREN) 1 % GEL Apply 2 g topically 4 (four) times daily. 1 Tube 5  . escitalopram (LEXAPRO) 20 MG tablet Take 1 tablet (20 mg total) by mouth every morning. 90 tablet 1  . fluticasone furoate-vilanterol (BREO ELLIPTA) 100-25 MCG/INH AEPB Inhale 1 puff into the lungs daily. 28 each 3  . lansoprazole (PREVACID SOLUTAB) 30 MG disintegrating tablet Take 30 mg by mouth 2 (two) times daily as needed.    Marland Kitchen lisinopril (PRINIVIL,ZESTRIL) 10 MG tablet Take 1 tablet (10 mg total) by mouth daily. 90 tablet 1  . LORazepam (ATIVAN) 1 MG tablet Take 1 tablet (1 mg total) by mouth 2 (two) times daily as needed for anxiety (panic). 60 tablet 0  . lurasidone (LATUDA) 80 MG TABS tablet Take 1 tablet (80 mg total) by mouth every evening. 90 tablet 1  . mometasone (NASONEX) 50 MCG/ACT nasal spray Place 2 sprays into the nose daily. 17 g 6  . montelukast (SINGULAIR) 10 MG tablet Take 1 tablet (10 mg total) by mouth at bedtime. 90 tablet 1  . pravastatin (PRAVACHOL) 20 MG tablet Take 1 tablet (20 mg total) by mouth at bedtime. 90 tablet 1  . senna-docusate (SENOKOT S) 8.6-50 MG tablet Take 1 tablet by mouth at bedtime as needed. 30 tablet 1   No current facility-administered medications on file prior to visit.     BP (!) 130/92   Pulse (!) 117   Temp 98.4 F (36.9 C) (Oral)   Wt 260 lb (117.9 kg)   SpO2 98%   BMI 38.40 kg/m     Past Medical History:  Diagnosis Date  . Alcoholism (HCC)   . Arthritis   . Asthma   . Bipolar 1 disorder (HCC)   . COPD (chronic obstructive pulmonary disease) (HCC)   . Depression   . DJD (degenerative joint disease)     . Family history of adverse reaction to anesthesia    grandfather was difficult to wake up  . Fatty liver   . GERD (gastroesophageal reflux disease)   . Glaucoma   . Headache   . Hypertension   . Hypothyroidism   . Major depressive disorder   .  Sleep apnea    wears CPAP #7    Social History   Socioeconomic History  . Marital status: Married    Spouse name: Not on file  . Number of children: Not on file  . Years of education: Not on file  . Highest education level: Not on file  Social Needs  . Financial resource strain: Not on file  . Food insecurity - worry: Not on file  . Food insecurity - inability: Not on file  . Transportation needs - medical: Not on file  . Transportation needs - non-medical: Not on file  Occupational History  . Not on file  Tobacco Use  . Smoking status: Former Smoker    Packs/day: 0.50    Types: Cigarettes  . Smokeless tobacco: Former Neurosurgeon    Types: Snuff  Substance and Sexual Activity  . Alcohol use: No  . Drug use: No  . Sexual activity: Yes    Partners: Female    Birth control/protection: None  Other Topics Concern  . Not on file  Social History Narrative   He is on disability    Two adopted children and one biological     Past Surgical History:  Procedure Laterality Date  . Cervical Disk replacement     . GALLBLADDER SURGERY  2006  . HERNIA REPAIR    . left ankle surgery     . TOTAL KNEE ARTHROPLASTY Right 09/28/2017   Procedure: Right patellofemoral arthroplasty;  Surgeon: Tarry Kos, MD;  Location: [facility] OR;  Service: Orthopedics;  Laterality: Right;  Marland Kitchen VASECTOMY      Family History  Problem Relation Age of Onset  . Arthritis Mother   . Alcohol abuse Father   . Arthritis Father   . Hyperlipidemia Father   . Hypertension Father   . Glaucoma Father   . Alcohol abuse Maternal Grandfather   . Alcohol abuse Paternal Grandfather   . Arthritis Paternal Grandfather   . Hyperlipidemia Paternal Grandfather   . Heart disease  Paternal Grandfather   . Hypertension Paternal Grandfather   . Diabetes Other     Allergies  Allergen Reactions  . Hydrocodone Itching    Current Outpatient Medications on File Prior to Visit  Medication Sig Dispense Refill  . albuterol (PROVENTIL HFA;VENTOLIN HFA) 108 (90 Base) MCG/ACT inhaler Inhale 2 puffs into the lungs every 4 (four) hours as needed for wheezing or shortness of breath. 18 g 3  . amitriptyline (ELAVIL) 100 MG tablet Take 1 tablet (100 mg total) by mouth at bedtime. 90 tablet 1  . aspirin EC 325 MG tablet Take 1 tablet (325 mg total) by mouth 2 (two) times daily. 84 tablet 0  . cetirizine (ZYRTEC) 10 MG tablet Take 10 mg by mouth daily.    . colestipol (COLESTID) 1 g tablet Take 1 tablet (1 g total) by mouth 2 (two) times daily. 180 tablet 1  . diclofenac sodium (VOLTAREN) 1 % GEL Apply 2 g topically 4 (four) times daily. 1 Tube 5  . escitalopram (LEXAPRO) 20 MG tablet Take 1 tablet (20 mg total) by mouth every morning. 90 tablet 1  . fluticasone furoate-vilanterol (BREO ELLIPTA) 100-25 MCG/INH AEPB Inhale 1 puff into the lungs daily. 28 each 3  . lansoprazole (PREVACID SOLUTAB) 30 MG disintegrating tablet Take 30 mg by mouth 2 (two) times daily as needed.    Marland Kitchen lisinopril (PRINIVIL,ZESTRIL) 10 MG tablet Take 1 tablet (10 mg total) by mouth daily. 90 tablet 1  . LORazepam (ATIVAN) 1  MG tablet Take 1 tablet (1 mg total) by mouth 2 (two) times daily as needed for anxiety (panic). 60 tablet 0  . lurasidone (LATUDA) 80 MG TABS tablet Take 1 tablet (80 mg total) by mouth every evening. 90 tablet 1  . mometasone (NASONEX) 50 MCG/ACT nasal spray Place 2 sprays into the nose daily. 17 g 6  . montelukast (SINGULAIR) 10 MG tablet Take 1 tablet (10 mg total) by mouth at bedtime. 90 tablet 1  . pravastatin (PRAVACHOL) 20 MG tablet Take 1 tablet (20 mg total) by mouth at bedtime. 90 tablet 1  . senna-docusate (SENOKOT S) 8.6-50 MG tablet Take 1 tablet by mouth at bedtime as needed.  30 tablet 1   No current facility-administered medications on file prior to visit.     BP (!) 130/92   Pulse (!) 117   Temp 98.4 F (36.9 C) (Oral)   Wt 260 lb (117.9 kg)   SpO2 98%   BMI 38.40 kg/m       Objective:   Physical Exam  Constitutional: He is oriented to person, place, and time. He appears well-developed and well-nourished. No distress.  Cardiovascular: Regular rhythm, normal heart sounds and intact distal pulses. Tachycardia present.  Pulmonary/Chest: Effort normal and breath sounds normal. No respiratory distress. He has no wheezes. He has no rales. He exhibits no tenderness.  Neurological: He is alert and oriented to person, place, and time.  Skin: Skin is warm and dry. No rash noted. He is not diaphoretic. No erythema. No pallor.  Psychiatric: He has a normal mood and affect. His behavior is normal. Judgment and thought content normal.  Nursing note and vitals reviewed.     Assessment & Plan:  1. Tachycardia -Reviewing old records, last EKG done in December for preop due to knee replacement.  He was tachycardic but normal sinus rhythm.  As have previous EKGs. -Advised to cut out soda.  Will start on low-dose propanolol extended release - propranolol ER (INDERAL LA) 60 MG 24 hr capsule; Take 1 capsule (60 mg total) by mouth daily.  Dispense: 30 capsule; Refill: 1 -Continue to monitor his dose at home and me my chart message in 1 week  Shirline Frees, NP

## 2017-12-12 ENCOUNTER — Encounter (HOSPITAL_COMMUNITY): Payer: Self-pay | Admitting: Orthopaedic Surgery

## 2017-12-14 ENCOUNTER — Encounter: Payer: Self-pay | Admitting: Adult Health

## 2017-12-15 MED FILL — AMITRIPTYLINE HCL 100 MG TA: 100 | 90 days supply | Qty: 90 | Fill #0

## 2017-12-20 ENCOUNTER — Encounter (HOSPITAL_COMMUNITY): Payer: Self-pay | Admitting: Psychiatry

## 2017-12-21 ENCOUNTER — Encounter (HOSPITAL_COMMUNITY): Payer: Self-pay | Admitting: Psychiatry

## 2017-12-27 ENCOUNTER — Ambulatory Visit (INDEPENDENT_AMBULATORY_CARE_PROVIDER_SITE_OTHER): Payer: 59 | Admitting: Orthopaedic Surgery

## 2017-12-28 ENCOUNTER — Encounter: Payer: Self-pay | Admitting: Adult Health

## 2017-12-28 ENCOUNTER — Other Ambulatory Visit: Payer: Self-pay | Admitting: Adult Health

## 2017-12-28 DIAGNOSIS — J449 Chronic obstructive pulmonary disease, unspecified: Secondary | ICD-10-CM

## 2017-12-28 DIAGNOSIS — I10 Essential (primary) hypertension: Secondary | ICD-10-CM

## 2017-12-28 DIAGNOSIS — E785 Hyperlipidemia, unspecified: Secondary | ICD-10-CM

## 2017-12-28 DIAGNOSIS — Z760 Encounter for issue of repeat prescription: Secondary | ICD-10-CM

## 2017-12-28 MED FILL — raNITIdine HCL 150 MG TABS: 150 | 90 days supply | Qty: 180 | Fill #0

## 2017-12-28 MED FILL — ESCITALOPRAM 20 MG TABLET: 20 | 90 days supply | Qty: 90 | Fill #1

## 2017-12-29 MED ORDER — MONTELUKAST SODIUM 10 MG PO TABS
10.0000 mg | ORAL_TABLET | Freq: Every day | ORAL | 1 refills | Status: AC
Start: 2017-12-29 — End: ?

## 2017-12-29 MED ORDER — PRAVASTATIN SODIUM 20 MG PO TABS
20.0000 mg | ORAL_TABLET | Freq: Every day | ORAL | 1 refills | Status: DC
Start: 2017-12-29 — End: 2018-06-30

## 2017-12-29 MED ORDER — LISINOPRIL 10 MG PO TABS: 10.0000 mg | ORAL_TABLET | Freq: Every day | ORAL | 1 refills | Status: DC

## 2017-12-29 MED ORDER — FLUTICASONE FUROATE-VILANTEROL 100-25 MCG/INH IN AEPB
1.0000 | INHALATION_SPRAY | Freq: Every day | RESPIRATORY_TRACT | 1 refills | Status: AC
Start: 2017-12-29 — End: ?

## 2017-12-29 MED FILL — LISINOPRIL 10 MG TABS: 10 | 90 days supply | Qty: 90 | Fill #0

## 2017-12-29 MED FILL — BREO ELLIPTA 100-25 MCG INH: 100-25 | 90 days supply | Qty: 180 | Fill #0

## 2017-12-29 MED FILL — PRAVASTATIN SODIUM 20 MG TA: 20 | 90 days supply | Qty: 90 | Fill #0

## 2017-12-29 MED FILL — MONTELUKAST SOD 10 MG TAB: 10 | 90 days supply | Qty: 90 | Fill #0

## 2017-12-30 ENCOUNTER — Ambulatory Visit (INDEPENDENT_AMBULATORY_CARE_PROVIDER_SITE_OTHER): Payer: 59 | Admitting: Adult Health

## 2017-12-30 ENCOUNTER — Encounter: Payer: Self-pay | Admitting: Adult Health

## 2017-12-30 VITALS — BP 118/90 | Temp 98.3°F | Wt 259.0 lb

## 2017-12-30 DIAGNOSIS — M5414 Radiculopathy, thoracic region: Secondary | ICD-10-CM

## 2017-12-30 DIAGNOSIS — Z760 Encounter for issue of repeat prescription: Secondary | ICD-10-CM | POA: Diagnosis not present

## 2017-12-30 DIAGNOSIS — N529 Male erectile dysfunction, unspecified: Secondary | ICD-10-CM | POA: Diagnosis not present

## 2017-12-30 MED ORDER — MOMETASONE FUROATE 50 MCG/ACT NA SUSP: 2.0000 | Freq: Every day | NASAL | 6 refills | Status: AC

## 2017-12-30 MED ORDER — SILDENAFIL CITRATE 20 MG PO TABS: ORAL_TABLET | ORAL | 1 refills | Status: AC

## 2017-12-30 MED ORDER — PREDNISONE 10 MG PO TABS: ORAL_TABLET | ORAL | 0 refills | Status: DC

## 2017-12-30 MED FILL — MOMETASONE FUROATE 50 MCG S: 50 | 60 days supply | Qty: 17 | Fill #0

## 2017-12-30 MED FILL — predniSONE 10 MG TABS: 10 | 9 days supply | Qty: 21 | Fill #0

## 2017-12-30 NOTE — Progress Notes (Signed)
Subjective:    Patient ID: Colton PizzaJeffrey A Belgard Jr., male    DOB: Feb 19, 1976, 42 y.o.   MRN: [id number]  HPI  [age] year old male who  has a past medical history of Alcoholism (HCC), Arthritis, Asthma, Bipolar 1 disorder (HCC), COPD (chronic obstructive pulmonary disease) (HCC), Depression, DJD (degenerative joint disease), Family history of adverse reaction to anesthesia, Fatty liver, GERD (gastroesophageal reflux disease), Glaucoma, Headache, Hypertension, Hypothyroidism, Major depressive disorder, and Sleep apnea.  He presents to the office today for the acute on chronic complaint of thoracic back pain with radiating pain to the right flank.  He reports being worked up for this multiple times in [place] and actually had his gallbladder removed as they thought the pain due to gallbladder disease.  Reports that long car rides are the aggravating factor in all instances and he and his wife rode in the car to [place] recently.  He has been seen by neurology/pain management in the past and received steroid injections into the spin which did not help and he would like to forego procedure if possible.  Denies any issues with bowel or bladder.  He also complains of issues with erectile dysfunction in that he has trouble maintaining an erection.  He has not been able to engage in sexual activity with his wife for greater than 3 months.  Review of Systems   See HPI    Past Medical History:  Diagnosis Date  . Alcoholism (HCC)   . Arthritis   . Asthma   . Bipolar 1 disorder (HCC)   . COPD (chronic obstructive pulmonary disease) (HCC)   . Depression   . DJD (degenerative joint disease)   . Family history of adverse reaction to anesthesia    grandfather was difficult to wake up  . Fatty liver   . GERD (gastroesophageal reflux disease)   . Glaucoma   . Headache   . Hypertension   . Hypothyroidism   . Major depressive disorder   . Sleep apnea    wears CPAP #7    Social History   Socioeconomic  History  . Marital status: Married    Spouse name: Not on file  . Number of children: Not on file  . Years of education: Not on file  . Highest education level: Not on file  Social Needs  . Financial resource strain: Not on file  . Food insecurity - worry: Not on file  . Food insecurity - inability: Not on file  . Transportation needs - medical: Not on file  . Transportation needs - non-medical: Not on file  Occupational History  . Not on file  Tobacco Use  . Smoking status: Former Smoker    Packs/day: 0.50    Types: Cigarettes  . Smokeless tobacco: Former NeurosurgeonUser    Types: Snuff  Substance and Sexual Activity  . Alcohol use: No  . Drug use: No  . Sexual activity: Yes    Partners: Female    Birth control/protection: None  Other Topics Concern  . Not on file  Social History Narrative   He is on disability    Two adopted children and one biological     Past Surgical History:  Procedure Laterality Date  . Cervical Disk replacement     . GALLBLADDER SURGERY  2006  . HERNIA REPAIR    . left ankle surgery     . TOTAL KNEE ARTHROPLASTY Right 09/28/2017   Procedure: Right patellofemoral arthroplasty;  Surgeon: Tarry KosXu, Naiping M, MD;  Location: [facility] OR;  Service: Orthopedics;  Laterality: Right;  Marland Kitchen VASECTOMY      Family History  Problem Relation Age of Onset  . Arthritis Mother   . Alcohol abuse Father   . Arthritis Father   . Hyperlipidemia Father   . Hypertension Father   . Glaucoma Father   . Alcohol abuse Maternal Grandfather   . Alcohol abuse Paternal Grandfather   . Arthritis Paternal Grandfather   . Hyperlipidemia Paternal Grandfather   . Heart disease Paternal Grandfather   . Hypertension Paternal Grandfather   . Diabetes Other     Allergies  Allergen Reactions  . Hydrocodone Itching    Current Outpatient Medications on File Prior to Visit  Medication Sig Dispense Refill  . albuterol (PROVENTIL HFA;VENTOLIN HFA) 108 (90 Base) MCG/ACT inhaler Inhale 2 puffs  into the lungs every 4 (four) hours as needed for wheezing or shortness of breath. 18 g 3  . amitriptyline (ELAVIL) 100 MG tablet Take 1 tablet (100 mg total) by mouth at bedtime. 90 tablet 1  . cetirizine (ZYRTEC) 10 MG tablet Take 10 mg by mouth daily.    . colestipol (COLESTID) 1 g tablet Take 1 tablet (1 g total) by mouth 2 (two) times daily. 180 tablet 1  . diclofenac sodium (VOLTAREN) 1 % GEL Apply 2 g topically 4 (four) times daily. 1 Tube 5  . escitalopram (LEXAPRO) 20 MG tablet Take 1 tablet (20 mg total) by mouth every morning. 90 tablet 1  . fluticasone furoate-vilanterol (BREO ELLIPTA) 100-25 MCG/INH AEPB Inhale 1 puff into the lungs daily. 84 each 1  . lansoprazole (PREVACID SOLUTAB) 30 MG disintegrating tablet Take 30 mg by mouth 2 (two) times daily as needed.    Marland Kitchen lisinopril (PRINIVIL,ZESTRIL) 10 MG tablet Take 1 tablet (10 mg total) by mouth daily. 90 tablet 1  . LORazepam (ATIVAN) 1 MG tablet Take 1 tablet (1 mg total) by mouth 2 (two) times daily as needed for anxiety (panic). 60 tablet 0  . lurasidone (LATUDA) 80 MG TABS tablet Take 1 tablet (80 mg total) by mouth every evening. 90 tablet 1  . mometasone (NASONEX) 50 MCG/ACT nasal spray Place 2 sprays into the nose daily. 17 g 6  . montelukast (SINGULAIR) 10 MG tablet Take 1 tablet (10 mg total) by mouth at bedtime. 90 tablet 1  . pravastatin (PRAVACHOL) 20 MG tablet Take 1 tablet (20 mg total) by mouth at bedtime. 90 tablet 1  . propranolol ER (INDERAL LA) 60 MG 24 hr capsule Take 1 capsule (60 mg total) by mouth daily. 30 capsule 1  . ranitidine (ZANTAC) 150 MG tablet TAKE 1 TABLET  150 MG TOTAL  BY MOUTH TWICE A DAY  1   No current facility-administered medications on file prior to visit.     BP 118/90 (BP Location: Left Arm)   Temp 98.3 F (36.8 C) (Oral)   Wt 259 lb (117.5 kg)   BMI 38.25 kg/m       Objective:   Physical Exam  Constitutional: He is oriented to person, place, and time. He appears well-developed  and well-nourished. No distress.  Cardiovascular: Normal rate, regular rhythm, normal heart sounds and intact distal pulses. Exam reveals no gallop and no friction rub.  No murmur heard. Pulmonary/Chest: Effort normal and breath sounds normal.  Musculoskeletal: Normal range of motion. He exhibits no edema, tenderness or deformity.  Neurological: He is alert and oriented to person, place, and time. He has normal strength and normal  reflexes. He displays normal reflexes. No cranial nerve deficit or sensory deficit. He exhibits normal muscle tone. Coordination normal.  Tenderness with palpation along T12-L2.  Tenderness along left flank and underside of right rib cage  Skin: Skin is warm and dry. No rash noted. He is not diaphoretic. No erythema. No pallor.  Psychiatric: He has a normal mood and affect. His behavior is normal. Judgment and thought content normal.  Nursing note and vitals reviewed.     Assessment & Plan:  1. Thoracic radiculopathy - Will trial prednisone taper. Get MRI of thoracic and lumbar spine and refer to Neurosurg - predniSONE (DELTASONE) 10 MG tablet; 40 mg x 3 days, 20 mg x 3 days, 10 mg x 3 days  Dispense: 21 tablet; Refill: 0 - MR Thoracic Spine Wo Contrast; Future - MR Lumbar Spine Wo Contrast; Future - Ambulatory referral to Neurosurgery  2. Medication refill  - mometasone (NASONEX) 50 MCG/ACT nasal spray; Place 2 sprays into the nose daily.  Dispense: 17 g; Refill: 6  3. Erectile dysfunction, unspecified erectile dysfunction type - Advised to start off with taking one pill. Stop medication if lightheaded or dizzy  - sildenafil (REVATIO) 20 MG tablet; Take 1-4 tablets as needed  Dispense: 30 tablet; Refill: 1   Shirline Frees, NP

## 2017-12-30 NOTE — Progress Notes (Addendum)
Subjective:    Patient ID: Colton PizzaJeffrey A Brunell Jr., male    DOB: 11-08-1975, 42 y.o.   MRN: [id number]  Back Pain    He is being seen for long-term mid-thoracic back pain that radiates around to his lower rib cage in the front. He denies numbness or tingling in the arm or rib cage.  He says this pain is brought on by prolonged sitting such as car rides. Pain is relieved by raising his right arm above his head or by standing or walking.  He does occasionally using 800 mg of Ibuprofen with 1000 mg of Tylenol. He has previously been treated with epidural injections with his previous neurologist in [place].  He has never tried PT.  His last MRI was 2-3 years ago in [place] and he is unsure of the results.  This study is not available in Epic. He is accompanied by his wife and they would like referral to neurology or neurosurgery to look at further options for treatment.   Review of Systems  Constitutional: Negative.   Respiratory: Negative.   Cardiovascular: Negative.   Musculoskeletal: Positive for back pain. Negative for gait problem, neck pain and neck stiffness.   Past Medical History:  Diagnosis Date  . Alcoholism (HCC)   . Arthritis   . Asthma   . Bipolar 1 disorder (HCC)   . COPD (chronic obstructive pulmonary disease) (HCC)   . Depression   . DJD (degenerative joint disease)   . Family history of adverse reaction to anesthesia    grandfather was difficult to wake up  . Fatty liver   . GERD (gastroesophageal reflux disease)   . Glaucoma   . Headache   . Hypertension   . Hypothyroidism   . Major depressive disorder   . Sleep apnea    wears CPAP #7    Social History   Socioeconomic History  . Marital status: Married    Spouse name: Not on file  . Number of children: Not on file  . Years of education: Not on file  . Highest education level: Not on file  Social Needs  . Financial resource strain: Not on file  . Food insecurity - worry: Not on file  . Food insecurity -  inability: Not on file  . Transportation needs - medical: Not on file  . Transportation needs - non-medical: Not on file  Occupational History  . Not on file  Tobacco Use  . Smoking status: Former Smoker    Packs/day: 0.50    Types: Cigarettes  . Smokeless tobacco: Former NeurosurgeonUser    Types: Snuff  Substance and Sexual Activity  . Alcohol use: No  . Drug use: No  . Sexual activity: Yes    Partners: Female    Birth control/protection: None  Other Topics Concern  . Not on file  Social History Narrative   He is on disability    Two adopted children and one biological     Past Surgical History:  Procedure Laterality Date  . Cervical Disk replacement     . GALLBLADDER SURGERY  2006  . HERNIA REPAIR    . left ankle surgery     . TOTAL KNEE ARTHROPLASTY Right 09/28/2017   Procedure: Right patellofemoral arthroplasty;  Surgeon: Tarry KosXu, Naiping M, MD;  [facility];  Service: Orthopedics;  Laterality: Right;  Marland Kitchen. VASECTOMY      Family History  Problem Relation Age of Onset  . Arthritis Mother   . Alcohol abuse Father   .  Arthritis Father   . Hyperlipidemia Father   . Hypertension Father   . Glaucoma Father   . Alcohol abuse Maternal Grandfather   . Alcohol abuse Paternal Grandfather   . Arthritis Paternal Grandfather   . Hyperlipidemia Paternal Grandfather   . Heart disease Paternal Grandfather   . Hypertension Paternal Grandfather   . Diabetes Other     Allergies  Allergen Reactions  . Hydrocodone Itching    Current Outpatient Medications on File Prior to Visit  Medication Sig Dispense Refill  . albuterol (PROVENTIL HFA;VENTOLIN HFA) 108 (90 Base) MCG/ACT inhaler Inhale 2 puffs into the lungs every 4 (four) hours as needed for wheezing or shortness of breath. 18 g 3  . amitriptyline (ELAVIL) 100 MG tablet Take 1 tablet (100 mg total) by mouth at bedtime. 90 tablet 1  . cetirizine (ZYRTEC) 10 MG tablet Take 10 mg by mouth daily.    . colestipol (COLESTID) 1 g tablet Take  1 tablet (1 g total) by mouth 2 (two) times daily. 180 tablet 1  . diclofenac sodium (VOLTAREN) 1 % GEL Apply 2 g topically 4 (four) times daily. 1 Tube 5  . escitalopram (LEXAPRO) 20 MG tablet Take 1 tablet (20 mg total) by mouth every morning. 90 tablet 1  . fluticasone furoate-vilanterol (BREO ELLIPTA) 100-25 MCG/INH AEPB Inhale 1 puff into the lungs daily. 84 each 1  . lansoprazole (PREVACID SOLUTAB) 30 MG disintegrating tablet Take 30 mg by mouth 2 (two) times daily as needed.    Marland Kitchen lisinopril (PRINIVIL,ZESTRIL) 10 MG tablet Take 1 tablet (10 mg total) by mouth daily. 90 tablet 1  . LORazepam (ATIVAN) 1 MG tablet Take 1 tablet (1 mg total) by mouth 2 (two) times daily as needed for anxiety (panic). 60 tablet 0  . lurasidone (LATUDA) 80 MG TABS tablet Take 1 tablet (80 mg total) by mouth every evening. 90 tablet 1  . mometasone (NASONEX) 50 MCG/ACT nasal spray Place 2 sprays into the nose daily. 17 g 6  . montelukast (SINGULAIR) 10 MG tablet Take 1 tablet (10 mg total) by mouth at bedtime. 90 tablet 1  . pravastatin (PRAVACHOL) 20 MG tablet Take 1 tablet (20 mg total) by mouth at bedtime. 90 tablet 1  . propranolol ER (INDERAL LA) 60 MG 24 hr capsule Take 1 capsule (60 mg total) by mouth daily. 30 capsule 1  . ranitidine (ZANTAC) 150 MG tablet TAKE 1 TABLET  150 MG TOTAL  BY MOUTH TWICE A DAY  1   No current facility-administered medications on file prior to visit.     BP 118/90 (BP Location: Left Arm)   Temp 98.3 F (36.8 C) (Oral)   Wt 259 lb (117.5 kg)   BMI 38.25 kg/m      Objective:   Physical Exam  Constitutional: He is oriented to person, place, and time. He appears well-developed and well-nourished. No distress.  Cardiovascular: Normal rate, regular rhythm, normal heart sounds and intact distal pulses. Exam reveals no gallop and no friction rub.  No murmur heard. Pulmonary/Chest: Effort normal and breath sounds normal. No respiratory distress. He has no wheezes. He has no  rales.  Musculoskeletal: Normal range of motion. He exhibits tenderness. He exhibits no edema or deformity.       Thoracic back: He exhibits tenderness and bony tenderness. He exhibits normal range of motion, no swelling, no edema, no deformity and no spasm.  Neurological: He is alert and oriented to person, place, and time. He exhibits normal  muscle tone.  Reflex Scores:      Bicep reflexes are 3+ on the right side. No numbness or tingling with right arm raises.   Skin: He is not diaphoretic.  Nursing note and vitals reviewed.     Assessment & Plan:  1. Thoracic radiculopathy Will try a round of steroids to see if we can calm down this flare.  Will check radiology studies and notify of results. May continue Tylenol or Motrin for pain relief. Gentle stretching.  Consider PT in the future.  - predniSONE (DELTASONE) 10 MG tablet; 40 mg x 3 days, 20 mg x 3 days, 10 mg x 3 days  Dispense: 21 tablet; Refill: 0 - MR Thoracic Spine Wo Contrast; Future - MR Lumbar Spine Wo Contrast; Future - Ambulatory referral to Neurosurgery  2. Medication refill Seasonal allergies.  Refill of nasonex ordered. - mometasone (NASONEX) 50 MCG/ACT nasal spray; Place 2 sprays into the nose daily.  Dispense: 17 g; Refill: 6  3. Erectile dysfunction, unspecified erectile dysfunction type - sildenafil (REVATIO) 20 MG tablet; Take 1-4 tablets as needed  Dispense: 30 tablet; Refill: 1  Follow-up as needed.  Areatha Kalata C Girtie Wiersma BSN RN NP student

## 2018-01-02 MED FILL — SILDENAFIL 20 MG TABLET: 20 | 8 days supply | Qty: 30 | Fill #0

## 2018-01-02 MED FILL — PRAVASTATIN SODIUM 20 MG TA: 20 | 90 days supply | Qty: 90 | Fill #1

## 2018-01-03 ENCOUNTER — Ambulatory Visit (INDEPENDENT_AMBULATORY_CARE_PROVIDER_SITE_OTHER): Payer: 59 | Admitting: Orthopaedic Surgery

## 2018-01-04 ENCOUNTER — Encounter: Payer: Self-pay | Admitting: Adult Health

## 2018-01-06 ENCOUNTER — Other Ambulatory Visit: Payer: Self-pay | Admitting: Adult Health

## 2018-01-06 ENCOUNTER — Encounter: Payer: Self-pay | Admitting: Adult Health

## 2018-01-06 DIAGNOSIS — K2100 Gastro-esophageal reflux disease with esophagitis, without bleeding: Secondary | ICD-10-CM

## 2018-01-06 MED ORDER — OMEPRAZOLE 20 MG PO CPDR: 20.0000 mg | DELAYED_RELEASE_CAPSULE | Freq: Every day | ORAL | 3 refills | Status: AC

## 2018-01-06 MED FILL — OMEPRAZOLE 20 MG CAP: 20 | 90 days supply | Qty: 90 | Fill #0

## 2018-01-08 ENCOUNTER — Encounter: Payer: Self-pay | Admitting: Adult Health

## 2018-01-10 ENCOUNTER — Other Ambulatory Visit: Payer: Self-pay | Admitting: Adult Health

## 2018-01-10 ENCOUNTER — Encounter: Payer: Self-pay | Admitting: Adult Health

## 2018-01-10 DIAGNOSIS — R000 Tachycardia, unspecified: Secondary | ICD-10-CM

## 2018-01-10 MED ORDER — PROPRANOLOL HCL ER 60 MG PO CP24: 60.0000 mg | ORAL_CAPSULE | Freq: Every day | ORAL | 3 refills | Status: DC

## 2018-01-10 MED FILL — PROPRANOLOL HCL ER 60 MG CP: 60 | 30 days supply | Qty: 30 | Fill #1

## 2018-01-15 ENCOUNTER — Other Ambulatory Visit: Payer: Self-pay

## 2018-01-22 ENCOUNTER — Ambulatory Visit
Admission: RE | Admit: 2018-01-22 | Discharge: 2018-01-22 | Disposition: A | Discharge status: Home / Self Care | Payer: 59 | Source: Ambulatory Visit | Attending: Adult Health | Admitting: Adult Health

## 2018-01-22 DIAGNOSIS — M5414 Radiculopathy, thoracic region: Secondary | ICD-10-CM

## 2018-01-22 DIAGNOSIS — M5136 Other intervertebral disc degeneration, lumbar region: Secondary | ICD-10-CM | POA: Diagnosis not present

## 2018-01-22 DIAGNOSIS — M5134 Other intervertebral disc degeneration, thoracic region: Secondary | ICD-10-CM | POA: Diagnosis not present

## 2018-01-24 ENCOUNTER — Encounter (HOSPITAL_COMMUNITY): Payer: Self-pay | Admitting: Psychiatry

## 2018-01-24 ENCOUNTER — Other Ambulatory Visit (HOSPITAL_COMMUNITY): Payer: Self-pay | Admitting: Psychiatry

## 2018-01-24 MED ORDER — PRAZOSIN HCL 2 MG PO CAPS
ORAL_CAPSULE | ORAL | 1 refills | Status: DC
Start: 2018-01-24 — End: 2018-02-15

## 2018-01-24 MED FILL — PRAZOSIN 2 MG CAPSULE: 2 | 30 days supply | Qty: 60 | Fill #0

## 2018-01-25 ENCOUNTER — Encounter (INDEPENDENT_AMBULATORY_CARE_PROVIDER_SITE_OTHER): Payer: Self-pay | Admitting: Orthopaedic Surgery

## 2018-02-05 ENCOUNTER — Encounter (HOSPITAL_COMMUNITY): Payer: Self-pay | Admitting: Psychiatry

## 2018-02-06 MED FILL — PROPRANOLOL HCL ER 60 MG CP: 60 | 90 days supply | Qty: 90 | Fill #0

## 2018-02-07 ENCOUNTER — Encounter: Payer: Self-pay | Admitting: Adult Health

## 2018-02-08 ENCOUNTER — Encounter: Payer: Self-pay | Admitting: Adult Health

## 2018-02-08 MED ORDER — PREDNISONE 10 MG PO TABS: 10.0000 mg | ORAL_TABLET | Freq: Every day | ORAL | 0 refills | Status: DC

## 2018-02-08 MED FILL — predniSONE 10 MG TABS: 10 | 5 days supply | Qty: 5 | Fill #0

## 2018-02-15 ENCOUNTER — Encounter (HOSPITAL_COMMUNITY): Payer: Self-pay | Admitting: Psychiatry

## 2018-02-15 ENCOUNTER — Ambulatory Visit (INDEPENDENT_AMBULATORY_CARE_PROVIDER_SITE_OTHER): Payer: 59 | Admitting: Psychiatry

## 2018-02-15 VITALS — BP 111/82 | HR 84 | Ht 69.0 in | Wt 266.0 lb

## 2018-02-15 DIAGNOSIS — Z760 Encounter for issue of repeat prescription: Secondary | ICD-10-CM

## 2018-02-15 DIAGNOSIS — F319 Bipolar disorder, unspecified: Secondary | ICD-10-CM

## 2018-02-15 DIAGNOSIS — Z79899 Other long term (current) drug therapy: Secondary | ICD-10-CM

## 2018-02-15 DIAGNOSIS — F3130 Bipolar disorder, current episode depressed, mild or moderate severity, unspecified: Secondary | ICD-10-CM | POA: Diagnosis not present

## 2018-02-15 DIAGNOSIS — F4312 Post-traumatic stress disorder, chronic: Secondary | ICD-10-CM | POA: Diagnosis not present

## 2018-02-15 DIAGNOSIS — Z87891 Personal history of nicotine dependence: Secondary | ICD-10-CM | POA: Diagnosis not present

## 2018-02-15 DIAGNOSIS — Z811 Family history of alcohol abuse and dependence: Secondary | ICD-10-CM | POA: Diagnosis not present

## 2018-02-15 MED ORDER — PRAZOSIN HCL 5 MG PO CAPS: 5.0000 mg | ORAL_CAPSULE | Freq: Every day | ORAL | 1 refills | Status: DC

## 2018-02-15 MED ORDER — AMITRIPTYLINE HCL 100 MG PO TABS: 100.0000 mg | ORAL_TABLET | Freq: Every day | ORAL | 1 refills | Status: DC

## 2018-02-15 MED ORDER — CLONAZEPAM 1 MG PO TABS: 1.0000 mg | ORAL_TABLET | Freq: Three times a day (TID) | ORAL | 2 refills | Status: DC | PRN

## 2018-02-15 MED ORDER — ESCITALOPRAM OXALATE 20 MG PO TABS: 20.0000 mg | ORAL_TABLET | ORAL | 1 refills | Status: DC

## 2018-02-15 MED FILL — PRAZOSIN 5 MG CAPSULE: 5 | 90 days supply | Qty: 90 | Fill #0

## 2018-02-15 MED FILL — clonazePAM 1 MG TABS: 1 | 30 days supply | Qty: 90 | Fill #0

## 2018-02-15 NOTE — Progress Notes (Signed)
[facility] MD/PA/NP OP Progress Note  02/15/2018 10:59 AM Colton Vang.  MRN:  [id number]  Chief Complaint: med management, anxiety  HPI: Colton Vang. reports that his anxiety has been higher, and when his anxiety is higher he can be somewhat destructive, slamming doors, breaking objects.  He reports that he is always very ashamed and tends to try to calm himself down before he gets to that point.  He has had a couple episodes over the past month where he broke small objects, of a vape, and then felt very ashamed and guilty.  He does not get aggressive with other people.  He reports that he knows that he learn to these bad habits of anger from his father and he does want to work with a therapist to work on correcting this.  He reports that the Ativan seems to help a little bit, but it wears away too quickly before he is fully calm down.  We agreed to switch to clonazepam 1 mg which he can take up to 3 times a day as needed.  He reports that even with the clonazepam he might not use it for 1 or 2 weeks at a time and then will take it if he knows that he is going to have a more stressful few day.  Or going out into the public.   Reviewed medication safety and benzodiazepine risks.  We agreed to follow-up in 8 weeks.  I also shared with the patient that I will be transitioning out of office in 4 months, so that he is aware of the upcoming change in provider.  Visit Diagnosis:    ICD-10-CM   1. Bipolar 1 disorder, depressed (HCC) F31.9 clonazePAM (KLONOPIN) 1 MG tablet    prazosin (MINIPRESS) 5 MG capsule    escitalopram (LEXAPRO) 20 MG tablet  2. Chronic post-traumatic stress disorder (PTSD) F43.12 clonazePAM (KLONOPIN) 1 MG tablet    prazosin (MINIPRESS) 5 MG capsule  3. Medication refill Z76.0 amitriptyline (ELAVIL) 100 MG tablet    Past Psychiatric History: See intake H&P for full details. Reviewed, with no updates at this time.   Past Medical History:  Past Medical History:   Diagnosis Date  . Alcoholism (HCC)   . Arthritis   . Asthma   . Bipolar 1 disorder (HCC)   . COPD (chronic obstructive pulmonary disease) (HCC)   . Depression   . DJD (degenerative joint disease)   . Family history of adverse reaction to anesthesia    grandfather was difficult to wake up  . Fatty liver   . GERD (gastroesophageal reflux disease)   . Glaucoma   . Headache   . Hypertension   . Hypothyroidism   . Major depressive disorder   . Sleep apnea    wears CPAP #7    Past Surgical History:  Procedure Laterality Date  . Cervical Disk replacement     . GALLBLADDER SURGERY  2006  . HERNIA REPAIR    . left ankle surgery     . TOTAL KNEE ARTHROPLASTY Right 09/28/2017   Procedure: Right patellofemoral arthroplasty;  Surgeon: Tarry Kos, MD;  Location[facility];  Service: Orthopedics;  Laterality: Right;  Marland Kitchen VASECTOMY      Family Psychiatric History: See intake H&P for full details. Reviewed, with no updates at this time.   Family History:  Family History  Problem Relation Age of Onset  . Arthritis Mother   . Alcohol abuse Father   . Arthritis Father   .  Hyperlipidemia Father   . Hypertension Father   . Glaucoma Father   . Alcohol abuse Maternal Grandfather   . Alcohol abuse Paternal Grandfather   . Arthritis Paternal Grandfather   . Hyperlipidemia Paternal Grandfather   . Heart disease Paternal Grandfather   . Hypertension Paternal Grandfather   . Diabetes Other     Social History:  Social History   Socioeconomic History  . Marital status: Married    Spouse name: Not on file  . Number of children: Not on file  . Years of education: Not on file  . Highest education level: Not on file  Occupational History  . Not on file  Social Needs  . Financial resource strain: Not on file  . Food insecurity:    Worry: Not on file    Inability: Not on file  . Transportation needs:    Medical: Not on file    Non-medical: Not on file  Tobacco Use  . Smoking status:  Former Smoker    Packs/day: 0.50    Types: Cigarettes  . Smokeless tobacco: Former Neurosurgeon    Types: Snuff  Substance and Sexual Activity  . Alcohol use: No  . Drug use: No  . Sexual activity: Yes    Partners: Female    Birth control/protection: None  Lifestyle  . Physical activity:    Days per week: Not on file    Minutes per session: Not on file  . Stress: Not on file  Relationships  . Social connections:    Talks on phone: Not on file    Gets together: Not on file    Attends religious service: Not on file    Active member of club or organization: Not on file    Attends meetings of clubs or organizations: Not on file    Relationship status: Not on file  Other Topics Concern  . Not on file  Social History Narrative   He is on disability    Two adopted children and one biological     Allergies:  Allergies  Allergen Reactions  . Hydrocodone Itching    Metabolic Disorder Labs: Lab Results  Component Value Date   HGBA1C 6.0 08/12/2017   No results found for: PROLACTIN Lab Results  Component Value Date   CHOL 206 (H) 08/12/2017   TRIG 151.0 (H) 08/12/2017   HDL 51.00 08/12/2017   CHOLHDL 4 08/12/2017   VLDL 30.2 08/12/2017   LDLCALC 125 (H) 08/12/2017   Lab Results  Component Value Date   TSH 3.26 08/12/2017    Therapeutic Level Labs: No results found for: LITHIUM No results found for: VALPROATE No components found for:  CBMZ  Current Medications: Current Outpatient Medications  Medication Sig Dispense Refill  . albuterol (PROVENTIL HFA;VENTOLIN HFA) 108 (90 Base) MCG/ACT inhaler Inhale 2 puffs into the lungs every 4 (four) hours as needed for wheezing or shortness of breath. 18 g 3  . amitriptyline (ELAVIL) 100 MG tablet Take 1 tablet (100 mg total) by mouth at bedtime. 90 tablet 1  . cetirizine (ZYRTEC) 10 MG tablet Take 10 mg by mouth daily.    . colestipol (COLESTID) 1 g tablet Take 1 tablet (1 g total) by mouth 2 (two) times daily. 180 tablet 1  .  escitalopram (LEXAPRO) 20 MG tablet Take 1 tablet (20 mg total) by mouth every morning. 90 tablet 1  . fluticasone furoate-vilanterol (BREO ELLIPTA) 100-25 MCG/INH AEPB Inhale 1 puff into the lungs daily. 84 each 1  . lansoprazole (  PREVACID SOLUTAB) 30 MG disintegrating tablet Take 30 mg by mouth 2 (two) times daily as needed.    Marland Kitchen lisinopril (PRINIVIL,ZESTRIL) 10 MG tablet Take 1 tablet (10 mg total) by mouth daily. 90 tablet 1  . lurasidone (LATUDA) 80 MG TABS tablet Take 1 tablet (80 mg total) by mouth every evening. 90 tablet 1  . mometasone (NASONEX) 50 MCG/ACT nasal spray Place 2 sprays into the nose daily. 17 g 6  . montelukast (SINGULAIR) 10 MG tablet Take 1 tablet (10 mg total) by mouth at bedtime. 90 tablet 1  . omeprazole (PRILOSEC) 20 MG capsule Take 1 capsule (20 mg total) by mouth daily. 90 capsule 3  . pravastatin (PRAVACHOL) 20 MG tablet Take 1 tablet (20 mg total) by mouth at bedtime. 90 tablet 1  . prazosin (MINIPRESS) 5 MG capsule Take 1 capsule (5 mg total) by mouth at bedtime for 7 days. 90 capsule 1  . propranolol ER (INDERAL LA) 60 MG 24 hr capsule Take 1 capsule (60 mg total) by mouth daily. 90 capsule 3  . ranitidine (ZANTAC) 150 MG tablet TAKE 1 TABLET  150 MG TOTAL  BY MOUTH TWICE A DAY  1  . sildenafil (REVATIO) 20 MG tablet Take 1-4 tablets as needed 30 tablet 1  . clonazePAM (KLONOPIN) 1 MG tablet Take 1 tablet (1 mg total) by mouth 3 (three) times daily as needed for anxiety. 90 tablet 2   No current facility-administered medications for this visit.     Musculoskeletal: Strength & Muscle Tone: within normal limits Gait & Station: normal Patient leans: N/A  Psychiatric Specialty Exam: ROS  Blood pressure 111/82, pulse 84, height  (1.753 m), weight 266 lb (120.7 kg), SpO2 96 %.Body mass index is 39.28 kg/m.  General Appearance: Casual and Fairly Groomed  Eye Contact:  Fair  Speech:  Clear and Coherent and Normal Rate  Volume:  Normal  Mood:  Anxious  and Euthymic  Affect:  Congruent  Thought Process:  Coherent and Descriptions of Associations: Intact  Orientation:  Full (Time, Place, and Person)  Thought Content: Logical   Suicidal Thoughts:  No  Homicidal Thoughts:  No  Memory:  Immediate;   Fair  Judgement:  Fair  Insight:  Fair  Psychomotor Activity:  Normal  Concentration:  Concentration: Fair  Recall:  Fair  Fund of Knowledge: Good  Language: Good  Akathisia:  Negative  Handed:  Right  AIMS (if indicated): not done  Assets:  Communication Skills Desire for Improvement Financial Resources/Insurance Housing  ADL's:  Intact  Cognition: WNL  Sleep:  Good   Screenings:   Assessment and Plan:  Colton Vang. presents with a flare in episodic anger and anxiety.  He does have a history of intermittent explosive disorder, and this tends to be exacerbated during periods of increased public activities, or outings with his family.  The Ativan has been generally effective but seems to wear away too quickly, so we agreed to switch him to clonazepam.  He reports that when he is at home or in his comfort zone, his mood has been stable.  We agreed to continue his other psychiatric medications as below.  He denies any thoughts to harm anybody else or himself, and reflects with shame on his episodic agitation.  He wants to work with a therapist so that he can teach his children to be better than he is in terms of anger management.  1. Bipolar 1 disorder, depressed (HCC)   2.  Chronic post-traumatic stress disorder (PTSD)   3. Medication refill     Status of current problems: flair in anxiety/anger  Labs Ordered: No orders of the defined types were placed in this encounter.   Labs Reviewed: n/a  Collateral Obtained/Records Reviewed: n/a  Plan:  Continue Lexapro 20 mg, Elavil 100 mg, Latuda 80 mg Increase prazosin to 5 mg nightly to better target nightmares, still having some breakthrough Switch from Ativan to clonazepam 1  mg 3 times daily prn Therapy referral  RTC in 8 weeks  I spent 25 minutes with the patient in direct face-to-face clinical care.  Greater than 50% of this time was spent in counseling and coordination of care with the patient.    Burnard Leigh, MD 02/15/2018, 10:59 AM

## 2018-02-22 MED FILL — MOMETASONE FUROATE 50 MCG S: 50 | 60 days supply | Qty: 17 | Fill #1

## 2018-02-22 MED FILL — COLESTIPOL HCL 1 GM TABLET: 1 | 90 days supply | Qty: 180 | Fill #1

## 2018-03-06 MED FILL — LATUDA 80 MG TABLET: 80 | 90 days supply | Qty: 90 | Fill #1

## 2018-03-08 ENCOUNTER — Encounter: Payer: Self-pay | Admitting: Adult Health

## 2018-03-14 MED FILL — AMITRIPTYLINE HCL 100 MG TA: 100 | 90 days supply | Qty: 90 | Fill #0

## 2018-03-15 MED FILL — clonazePAM 1 MG TABS: 1 | 30 days supply | Qty: 90 | Fill #1

## 2018-03-16 ENCOUNTER — Ambulatory Visit (INDEPENDENT_AMBULATORY_CARE_PROVIDER_SITE_OTHER): Payer: Self-pay | Admitting: Licensed Clinical Social Worker

## 2018-03-16 DIAGNOSIS — F319 Bipolar disorder, unspecified: Secondary | ICD-10-CM

## 2018-03-24 NOTE — Progress Notes (Signed)
Pt did not show for individual therapy session.  Dorann LodgeWes Vicki Chaffin, LPCA

## 2018-04-05 ENCOUNTER — Other Ambulatory Visit (HOSPITAL_COMMUNITY): Payer: Self-pay | Admitting: Psychiatry

## 2018-04-05 ENCOUNTER — Encounter (HOSPITAL_COMMUNITY): Payer: Self-pay | Admitting: Psychiatry

## 2018-04-05 DIAGNOSIS — F319 Bipolar disorder, unspecified: Secondary | ICD-10-CM

## 2018-04-05 MED ORDER — ESCITALOPRAM OXALATE 20 MG PO TABS: 20.0000 mg | ORAL_TABLET | ORAL | 0 refills | Status: DC

## 2018-04-12 ENCOUNTER — Ambulatory Visit (HOSPITAL_COMMUNITY): Payer: Self-pay | Admitting: Licensed Clinical Social Worker

## 2018-04-12 ENCOUNTER — Ambulatory Visit (INDEPENDENT_AMBULATORY_CARE_PROVIDER_SITE_OTHER): Payer: Medicare Other | Admitting: Psychiatry

## 2018-04-12 ENCOUNTER — Encounter (HOSPITAL_COMMUNITY): Payer: Self-pay | Admitting: Psychiatry

## 2018-04-12 DIAGNOSIS — Z811 Family history of alcohol abuse and dependence: Secondary | ICD-10-CM

## 2018-04-12 DIAGNOSIS — F4312 Post-traumatic stress disorder, chronic: Secondary | ICD-10-CM

## 2018-04-12 DIAGNOSIS — Z760 Encounter for issue of repeat prescription: Secondary | ICD-10-CM | POA: Diagnosis not present

## 2018-04-12 DIAGNOSIS — F319 Bipolar disorder, unspecified: Secondary | ICD-10-CM

## 2018-04-12 MED ORDER — AMITRIPTYLINE HCL 150 MG PO TABS: 150.0000 mg | ORAL_TABLET | Freq: Every day | ORAL | 0 refills | Status: DC

## 2018-04-12 MED ORDER — CLONAZEPAM 1 MG PO TABS
1.0000 mg | ORAL_TABLET | Freq: Three times a day (TID) | ORAL | 2 refills | Status: DC | PRN
Start: 2018-04-12 — End: 2018-06-14

## 2018-04-12 MED ORDER — PRAZOSIN HCL 5 MG PO CAPS: 5.0000 mg | ORAL_CAPSULE | Freq: Every day | ORAL | 0 refills | Status: DC

## 2018-04-12 MED ORDER — ESCITALOPRAM OXALATE 20 MG PO TABS: 20.0000 mg | ORAL_TABLET | ORAL | 0 refills | Status: DC

## 2018-04-12 NOTE — Progress Notes (Signed)
BH MD/PA/NP OP Progress Note  04/12/2018 11:29 AM Colton Vang.  MRN:  [id number]  Chief Complaint: med management, depressed  HPI: Colton Vang. reports that his anxiety seems better managed with the clonazepam, but he has been fairly depressed and hopeless about financial stressors, his wife's ongoing physical health issues, and he worries about being able to afford his medications.  I spent time with him educating him on resources that may assist with cost of medications.  He denies any suicidality or unsafe thoughts, but feels like his mood is definitely been more depressed.  We discussed increasing Elavil to 150 mg nightly for a more optimal dose in targeting depressive symptoms.  Disclosed to patient that this Clinical research associate is leaving this practice at the end of August 2019, and patients always has the right to choose their provider. Reassured patient that office will work to provide smooth transition of care whether they wish to remain at this office, or to continue with this provider, or seek alternative care options in community.  They expressed understanding.   Visit Diagnosis:    ICD-10-CM   1. Medication refill Z76.0 amitriptyline (ELAVIL) 150 MG tablet  2. Bipolar 1 disorder, depressed (HCC) F31.9 clonazePAM (KLONOPIN) 1 MG tablet    escitalopram (LEXAPRO) 20 MG tablet    prazosin (MINIPRESS) 5 MG capsule  3. Chronic post-traumatic stress disorder (PTSD) F43.12 clonazePAM (KLONOPIN) 1 MG tablet    prazosin (MINIPRESS) 5 MG capsule    Past Psychiatric History: See intake H&P for full details. Reviewed, with no updates at this time.   Past Medical History:  Past Medical History:  Diagnosis Date  . Alcoholism (HCC)   . Arthritis   . Asthma   . Bipolar 1 disorder (HCC)   . COPD (chronic obstructive pulmonary disease) (HCC)   . Depression   . DJD (degenerative joint disease)   . Family history of adverse reaction to anesthesia    grandfather was difficult to wake  up  . Fatty liver   . GERD (gastroesophageal reflux disease)   . Glaucoma   . Headache   . Hypertension   . Hypothyroidism   . Major depressive disorder   . Sleep apnea    wears CPAP #7    Past Surgical History:  Procedure Laterality Date  . Cervical Disk replacement     . GALLBLADDER SURGERY  2006  . HERNIA REPAIR    . left ankle surgery     . TOTAL KNEE ARTHROPLASTY Right 09/28/2017   Procedure: Right patellofemoral arthroplasty;  Surgeon: Tarry Kos, MD;  Location: [facility] OR;  Service: Orthopedics;  Laterality: Right;  Marland Kitchen VASECTOMY      Family Psychiatric History: See intake H&P for full details. Reviewed, with no updates at this time.   Family History:  Family History  Problem Relation Age of Onset  . Arthritis Mother   . Alcohol abuse Father   . Arthritis Father   . Hyperlipidemia Father   . Hypertension Father   . Glaucoma Father   . Alcohol abuse Maternal Grandfather   . Alcohol abuse Paternal Grandfather   . Arthritis Paternal Grandfather   . Hyperlipidemia Paternal Grandfather   . Heart disease Paternal Grandfather   . Hypertension Paternal Grandfather   . Diabetes Other     Social History:  Social History   Socioeconomic History  . Marital status: Married    Spouse name: Not on file  . Number of children: Not on file  .  Years of education: Not on file  . Highest education level: Not on file  Occupational History  . Not on file  Social Needs  . Financial resource strain: Not on file  . Food insecurity:    Worry: Not on file    Inability: Not on file  . Transportation needs:    Medical: Not on file    Non-medical: Not on file  Tobacco Use  . Smoking status: Former Smoker    Packs/day: 0.50    Types: Cigarettes  . Smokeless tobacco: Former Neurosurgeon    Types: Snuff  Substance and Sexual Activity  . Alcohol use: No  . Drug use: No  . Sexual activity: Yes    Partners: Female    Birth control/protection: None  Lifestyle  . Physical activity:     Days per week: Not on file    Minutes per session: Not on file  . Stress: Not on file  Relationships  . Social connections:    Talks on phone: Not on file    Gets together: Not on file    Attends religious service: Not on file    Active member of club or organization: Not on file    Attends meetings of clubs or organizations: Not on file    Relationship status: Not on file  Other Topics Concern  . Not on file  Social History Narrative   He is on disability    Two adopted children and one biological     Allergies:  Allergies  Allergen Reactions  . Hydrocodone Itching    Metabolic Disorder Labs: Lab Results  Component Value Date   HGBA1C 6.0 08/12/2017   No results found for: PROLACTIN Lab Results  Component Value Date   CHOL 206 (H) 08/12/2017   TRIG 151.0 (H) 08/12/2017   HDL 51.00 08/12/2017   CHOLHDL 4 08/12/2017   VLDL 30.2 08/12/2017   LDLCALC 125 (H) 08/12/2017   Lab Results  Component Value Date   TSH 3.26 08/12/2017    Therapeutic Level Labs: No results found for: LITHIUM No results found for: VALPROATE No components found for:  CBMZ  Current Medications: Current Outpatient Medications  Medication Sig Dispense Refill  . albuterol (PROVENTIL HFA;VENTOLIN HFA) 108 (90 Base) MCG/ACT inhaler Inhale 2 puffs into the lungs every 4 (four) hours as needed for wheezing or shortness of breath. 18 g 3  . amitriptyline (ELAVIL) 150 MG tablet Take 1 tablet (150 mg total) by mouth at bedtime. 90 tablet 0  . cetirizine (ZYRTEC) 10 MG tablet Take 10 mg by mouth daily.    . clonazePAM (KLONOPIN) 1 MG tablet Take 1 tablet (1 mg total) by mouth 3 (three) times daily as needed for anxiety. 90 tablet 2  . colestipol (COLESTID) 1 g tablet Take 1 tablet (1 g total) by mouth 2 (two) times daily. 180 tablet 1  . escitalopram (LEXAPRO) 20 MG tablet Take 1 tablet (20 mg total) by mouth every morning. 90 tablet 0  . fluticasone furoate-vilanterol (BREO ELLIPTA) 100-25 MCG/INH  AEPB Inhale 1 puff into the lungs daily. 84 each 1  . lansoprazole (PREVACID SOLUTAB) 30 MG disintegrating tablet Take 30 mg by mouth 2 (two) times daily as needed.    Marland Kitchen lisinopril (PRINIVIL,ZESTRIL) 10 MG tablet Take 1 tablet (10 mg total) by mouth daily. 90 tablet 1  . lurasidone (LATUDA) 80 MG TABS tablet Take 1 tablet (80 mg total) by mouth every evening. 90 tablet 1  . mometasone (NASONEX) 50 MCG/ACT nasal  spray Place 2 sprays into the nose daily. 17 g 6  . montelukast (SINGULAIR) 10 MG tablet Take 1 tablet (10 mg total) by mouth at bedtime. 90 tablet 1  . omeprazole (PRILOSEC) 20 MG capsule Take 1 capsule (20 mg total) by mouth daily. 90 capsule 3  . pravastatin (PRAVACHOL) 20 MG tablet Take 1 tablet (20 mg total) by mouth at bedtime. 90 tablet 1  . propranolol ER (INDERAL LA) 60 MG 24 hr capsule Take 1 capsule (60 mg total) by mouth daily. 90 capsule 3  . ranitidine (ZANTAC) 150 MG tablet TAKE 1 TABLET  150 MG TOTAL  BY MOUTH TWICE A DAY  1  . sildenafil (REVATIO) 20 MG tablet Take 1-4 tablets as needed 30 tablet 1  . prazosin (MINIPRESS) 5 MG capsule Take 1 capsule (5 mg total) by mouth at bedtime. 90 capsule 0   No current facility-administered medications for this visit.     Musculoskeletal: Strength & Muscle Tone: within normal limits Gait & Station: normal Patient leans: N/A  Psychiatric Specialty Exam: ROS  Blood pressure 128/84, pulse 95, height 5\' 9"  (1.753 m), weight 264 lb (119.7 kg), SpO2 98 %.Body mass index is 38.99 kg/m.  General Appearance: Casual and Fairly Groomed  Eye Contact:  Fair  Speech:  Clear and Coherent and Normal Rate  Volume:  Normal  Mood:  Dysphoric  Affect:  Congruent and Depressed  Thought Process:  Coherent and Descriptions of Associations: Intact  Orientation:  Full (Time, Place, and Person)  Thought Content: Logical   Suicidal Thoughts:  No  Homicidal Thoughts:  No  Memory:  Immediate;   Fair  Judgement:  Fair  Insight:  Fair   Psychomotor Activity:  Normal  Concentration:  Concentration: Fair  Recall:  Fair  Fund of Knowledge: Good  Language: Good  Akathisia:  Negative  Handed:  Right  AIMS (if indicated): not done  Assets:  Communication Skills Desire for Improvement Financial Resources/Insurance Housing  ADL's:  Intact  Cognition: WNL  Sleep:  Good   Screenings:   Assessment and Plan:  Colton PizzaJeffrey A Cleavenger Jr. presents with some ongoing breakthrough depression symptoms in the setting of financial stressors and worrying about his wife's physical health given that she has had to withdraw from work for medical leave of absence as she is having multiple back surgeries.  He reports that its been difficult for him to be able to shut down his mind and not worry and ruminate about the negative things going on in her life.  I spent time with him validating the stress of finances and his wife's physical health issues and spent time discussing ways that he can work on taking care of himself as well.  We agreed to increase Elavil to 150 mg nightly to provide some support of ongoing depressive symptoms.  1. Medication refill   2. Bipolar 1 disorder, depressed (HCC)   3. Chronic post-traumatic stress disorder (PTSD)     Status of current problems: flair in anxiety/anger  Labs Ordered: No orders of the defined types were placed in this encounter.   Labs Reviewed: n/a  Collateral Obtained/Records Reviewed: n/a  Plan:  Continue Lexapro 20 mg Increase Elavil 150 mg Latuda 80 mg prazosin 5 mg nightly  clonazepam 1 mg 3 times daily prn Therapy referral; patient had to cancel due to financial constraints RTC in 8 weeks  Burnard LeighAlexander Arya Eksir, MD 04/12/2018, 11:29 AM

## 2018-04-15 ENCOUNTER — Encounter: Payer: Self-pay | Admitting: Adult Health

## 2018-04-15 ENCOUNTER — Other Ambulatory Visit (HOSPITAL_COMMUNITY): Payer: Self-pay | Admitting: Psychiatry

## 2018-04-15 ENCOUNTER — Encounter (HOSPITAL_COMMUNITY): Payer: Self-pay | Admitting: Psychiatry

## 2018-04-15 DIAGNOSIS — Z760 Encounter for issue of repeat prescription: Secondary | ICD-10-CM

## 2018-04-15 MED ORDER — AMITRIPTYLINE HCL 150 MG PO TABS: 150.0000 mg | ORAL_TABLET | Freq: Every day | ORAL | 0 refills | Status: DC

## 2018-05-17 ENCOUNTER — Encounter: Payer: Self-pay | Admitting: Adult Health

## 2018-05-17 DIAGNOSIS — Z760 Encounter for issue of repeat prescription: Secondary | ICD-10-CM

## 2018-05-17 DIAGNOSIS — J449 Chronic obstructive pulmonary disease, unspecified: Secondary | ICD-10-CM

## 2018-05-18 ENCOUNTER — Other Ambulatory Visit (HOSPITAL_COMMUNITY): Payer: Self-pay

## 2018-05-18 ENCOUNTER — Encounter: Payer: Self-pay | Admitting: Adult Health

## 2018-05-18 DIAGNOSIS — Z760 Encounter for issue of repeat prescription: Secondary | ICD-10-CM

## 2018-05-18 DIAGNOSIS — R000 Tachycardia, unspecified: Secondary | ICD-10-CM

## 2018-05-18 DIAGNOSIS — I10 Essential (primary) hypertension: Secondary | ICD-10-CM

## 2018-05-18 DIAGNOSIS — F319 Bipolar disorder, unspecified: Secondary | ICD-10-CM

## 2018-05-18 DIAGNOSIS — F4312 Post-traumatic stress disorder, chronic: Secondary | ICD-10-CM

## 2018-05-18 MED ORDER — PRAZOSIN HCL 5 MG PO CAPS
5.0000 mg | ORAL_CAPSULE | Freq: Every day | ORAL | 0 refills | Status: DC
Start: 2018-05-18 — End: 2018-06-14

## 2018-05-18 MED ORDER — LISINOPRIL 10 MG PO TABS: 10.0000 mg | ORAL_TABLET | Freq: Every day | ORAL | 0 refills | Status: AC

## 2018-05-18 MED ORDER — PROPRANOLOL HCL ER 60 MG PO CP24
60.0000 mg | ORAL_CAPSULE | Freq: Every day | ORAL | 0 refills | Status: AC
Start: 2018-05-18 — End: ?

## 2018-05-18 MED ORDER — ALBUTEROL SULFATE HFA 108 (90 BASE) MCG/ACT IN AERS
2.0000 | INHALATION_SPRAY | RESPIRATORY_TRACT | 1 refills | Status: AC | PRN
Start: 2018-05-18 — End: ?

## 2018-06-01 ENCOUNTER — Telehealth (HOSPITAL_COMMUNITY): Payer: Self-pay

## 2018-06-01 DIAGNOSIS — Z760 Encounter for issue of repeat prescription: Secondary | ICD-10-CM

## 2018-06-01 MED ORDER — AMITRIPTYLINE HCL 150 MG PO TABS: 150.0000 mg | ORAL_TABLET | Freq: Every day | ORAL | 0 refills | Status: DC

## 2018-06-01 NOTE — Telephone Encounter (Signed)
Patient called requesting a refill on Amitriptyline 150mg  on 30 day supply. [facility] [address]

## 2018-06-01 NOTE — Telephone Encounter (Signed)
All set!

## 2018-06-03 ENCOUNTER — Encounter (HOSPITAL_COMMUNITY): Payer: Self-pay

## 2018-06-03 ENCOUNTER — Emergency Department (HOSPITAL_COMMUNITY): Payer: Medicare Other

## 2018-06-03 ENCOUNTER — Emergency Department (HOSPITAL_COMMUNITY)
Admission: EM | Admit: 2018-06-03 | Discharge: 2018-06-04 | Disposition: A | Discharge status: Home / Self Care | Payer: Medicare Other | Attending: Emergency Medicine | Admitting: Emergency Medicine

## 2018-06-03 ENCOUNTER — Other Ambulatory Visit: Payer: Self-pay

## 2018-06-03 DIAGNOSIS — J45909 Unspecified asthma, uncomplicated: Secondary | ICD-10-CM | POA: Diagnosis not present

## 2018-06-03 DIAGNOSIS — J449 Chronic obstructive pulmonary disease, unspecified: Secondary | ICD-10-CM | POA: Diagnosis not present

## 2018-06-03 DIAGNOSIS — Z87891 Personal history of nicotine dependence: Secondary | ICD-10-CM | POA: Diagnosis not present

## 2018-06-03 DIAGNOSIS — I10 Essential (primary) hypertension: Secondary | ICD-10-CM | POA: Diagnosis not present

## 2018-06-03 DIAGNOSIS — R0789 Other chest pain: Secondary | ICD-10-CM | POA: Insufficient documentation

## 2018-06-03 DIAGNOSIS — Z79899 Other long term (current) drug therapy: Secondary | ICD-10-CM | POA: Diagnosis not present

## 2018-06-03 DIAGNOSIS — E039 Hypothyroidism, unspecified: Secondary | ICD-10-CM | POA: Insufficient documentation

## 2018-06-03 LAB — POCT I-STAT TROPONIN I
TROPONIN I, POC: 0 ng/mL (ref 0.00–0.08)
TROPONIN I, POC: 0 ng/mL (ref 0.00–0.08)

## 2018-06-03 LAB — BASIC METABOLIC PANEL
Anion gap: 13 (ref 5–15)
BUN: 5 mg/dL — ABNORMAL LOW (ref 6–20)
CALCIUM: 9.4 mg/dL (ref 8.9–10.3)
CO2: 22 mmol/L (ref 22–32)
CREATININE: 1.21 mg/dL (ref 0.61–1.24)
Chloride: 104 mmol/L (ref 98–111)
GFR calc Af Amer: >60 mL/min (ref >60)
Glucose, Bld: 129 mg/dL — ABNORMAL HIGH (ref 70–99)
Potassium: 3.4 mmol/L — ABNORMAL LOW (ref 3.5–5.1)
SODIUM: 139 mmol/L (ref 135–145)

## 2018-06-03 LAB — CBC
HCT: 43.7 % (ref 39.0–52.0)
Hemoglobin: 15.3 g/dL (ref 13.0–17.0)
MCH: 30.1 pg (ref 26.0–34.0)
MCHC: 35 g/dL (ref 30.0–36.0)
MCV: 85.9 fL (ref 78.0–100.0)
PLATELETS: 272 10*3/uL (ref 150–400)
RBC: 5.09 MIL/uL (ref 4.22–5.81)
RDW: 12.4 % (ref 11.5–15.5)
WBC: 10.1 10*3/uL (ref 4.0–10.5)

## 2018-06-03 MED ORDER — ASPIRIN 81 MG PO CHEW
324.0000 mg | CHEWABLE_TABLET | Freq: Once | ORAL | Status: AC
  Administered 2018-06-03: 324 mg via ORAL
  Filled 2018-06-03: qty 4

## 2018-06-03 MED ORDER — FENTANYL CITRATE (PF) 100 MCG/2ML IJ SOLN
12.5000 ug | Freq: Once | INTRAMUSCULAR | Status: AC
  Administered 2018-06-03: 12.5 ug via INTRAVENOUS
  Filled 2018-06-03: qty 2

## 2018-06-03 NOTE — Discharge Instructions (Addendum)
As discussed, your evaluation today has been largely reassuring.  But, it is important that you monitor your condition carefully, and do not hesitate to return to the ED if you develop new, or concerning changes in your condition. ? ?Otherwise, please follow-up with your physician for appropriate ongoing care. ? ?

## 2018-06-03 NOTE — ED Notes (Signed)
Bed: WA12 Expected date:  Expected time:  Means of arrival:  Comments: Triage 1  

## 2018-06-03 NOTE — ED Provider Notes (Signed)
[facility]-EMERGENCY DEPT Provider Note   CSN: [id number] Arrival date & time: 06/03/18  1944     History   Chief Complaint Chief Complaint  Patient presents with  . Chest Pain    HPI Colton PizzaJeffrey A Caltagirone Jr. is a 42 y.o. male.  HPI Presents with concern of chest pain purulent pain began about 90 minutes prior to ED arrival. Patient was driving with his wife in the car, who presents with him, when the pain began. They note the patient had a substantial stressful experience earlier in the day, but otherwise has had no recent medication change, diet changes or social changes of significance. Patient knowledge his multiple medical issues, psychiatric disease, states that the latter is well managed with medication, the former with medication as well. The pain is sore, sternal, radiating to the left shoulder. No associated dyspnea, syncope, fever, chills per Patient smokes cigarettes, though less than he used to.  Past Medical History:  Diagnosis Date  . Alcoholism (HCC)   . Arthritis   . Asthma   . Bipolar 1 disorder (HCC)   . COPD (chronic obstructive pulmonary disease) (HCC)   . Depression   . DJD (degenerative joint disease)   . Family history of adverse reaction to anesthesia    grandfather was difficult to wake up  . Fatty liver   . GERD (gastroesophageal reflux disease)   . Glaucoma   . Headache   . Hypertension   . Hypothyroidism   . Major depressive disorder   . Sleep apnea    wears CPAP #7    Patient Active Problem List   Diagnosis Date Noted  . Status post right partial knee replacement 09/28/2017  . PTSD (post-traumatic stress disorder) 08/30/2017  . Mood disorder in conditions classified elsewhere 08/30/2017  . Hypothyroidism 08/12/2017  . Fatty liver disease, nonalcoholic 08/12/2017  . Essential hypertension 06/28/2017  . Glaucoma of both eyes 06/28/2017  . Hyperlipidemia 06/28/2017  . Medication refill 06/28/2017  . Chronic  obstructive pulmonary disease (HCC) 06/28/2017  . Bipolar 1 disorder, depressed (HCC) 06/28/2017    Past Surgical History:  Procedure Laterality Date  . Cervical Disk replacement     . GALLBLADDER SURGERY  2006  . HERNIA REPAIR    . left ankle surgery     . TOTAL KNEE ARTHROPLASTY Right 09/28/2017   Procedure: Right patellofemoral arthroplasty;  Surgeon: Tarry KosXu, Naiping M, MD;  [facility];  Service: Orthopedics;  Laterality: Right;  Marland Kitchen. VASECTOMY          Home Medications    Prior to Admission medications   Medication Sig Start Date End Date Taking? Authorizing Provider  albuterol (PROVENTIL HFA;VENTOLIN HFA) 108 (90 Base) MCG/ACT inhaler Inhale 2 puffs into the lungs every 4 (four) hours as needed for wheezing or shortness of breath. 05/18/18  Yes Nafziger, Kandee Keenory, NP  amitriptyline (ELAVIL) 150 MG tablet Take 1 tablet (150 mg total) by mouth at bedtime. 06/01/18  Yes Eksir, Bo McclintockAlexander Arya, MD  calcium carbonate (TUMS - DOSED IN MG ELEMENTAL CALCIUM) 500 MG chewable tablet Chew 1 tablet by mouth 2 (two) times daily as needed for indigestion or heartburn.   Yes [provider]  clonazePAM (KLONOPIN) 1 MG tablet Take 1 tablet (1 mg total) by mouth 3 (three) times daily as needed for anxiety. 04/12/18  Yes Eksir, Bo McclintockAlexander Arya, MD  colestipol (COLESTID) 1 g tablet Take 1 tablet (1 g total) by mouth 2 (two) times daily. Patient taking differently: Take 2  g by mouth at bedtime.  06/29/17  Yes Nafziger, Kandee Keen, NP  escitalopram (LEXAPRO) 20 MG tablet Take 1 tablet (20 mg total) by mouth every morning. Patient taking differently: Take 20 mg by mouth at bedtime.  04/12/18 04/12/19 Yes Eksir, Bo Mcclintock, MD  lisinopril (PRINIVIL,ZESTRIL) 10 MG tablet Take 1 tablet (10 mg total) by mouth daily. 05/18/18  Yes Nafziger, Kandee Keen, NP  lurasidone (LATUDA) 80 MG TABS tablet Take 1 tablet (80 mg total) by mouth every evening. 11/29/17  Yes Eksir, Bo Mcclintock, MD  pravastatin (PRAVACHOL) 20 MG tablet  Take 1 tablet (20 mg total) by mouth at bedtime. 12/29/17  Yes Nafziger, Kandee Keen, NP  prazosin (MINIPRESS) 5 MG capsule Take 1 capsule (5 mg total) by mouth at bedtime. 05/18/18 05/18/19 Yes Eksir, Bo Mcclintock, MD  propranolol ER (INDERAL LA) 60 MG 24 hr capsule Take 1 capsule (60 mg total) by mouth daily. 05/18/18  Yes Nafziger, Kandee Keen, NP  fluticasone furoate-vilanterol (BREO ELLIPTA) 100-25 MCG/INH AEPB Inhale 1 puff into the lungs daily. Patient not taking: Reported on 06/03/2018 12/29/17   Shirline Frees, NP  mometasone (NASONEX) 50 MCG/ACT nasal spray Place 2 sprays into the nose daily. Patient not taking: Reported on 06/03/2018 12/30/17   Shirline Frees, NP  montelukast (SINGULAIR) 10 MG tablet Take 1 tablet (10 mg total) by mouth at bedtime. Patient not taking: Reported on 06/03/2018 12/29/17   Shirline Frees, NP  omeprazole (PRILOSEC) 20 MG capsule Take 1 capsule (20 mg total) by mouth daily. Patient not taking: Reported on 06/03/2018 01/06/18   Shirline Frees, NP  sildenafil (REVATIO) 20 MG tablet Take 1-4 tablets as needed Patient taking differently: Take 20 mg by mouth 3 (three) times daily as needed (ed).  12/30/17   Shirline Frees, NP    Family History Family History  Problem Relation Age of Onset  . Arthritis Mother   . Alcohol abuse Father   . Arthritis Father   . Hyperlipidemia Father   . Hypertension Father   . Glaucoma Father   . Alcohol abuse Maternal Grandfather   . Alcohol abuse Paternal Grandfather   . Arthritis Paternal Grandfather   . Hyperlipidemia Paternal Grandfather   . Heart disease Paternal Grandfather   . Hypertension Paternal Grandfather   . Diabetes Other     Social History Social History   Tobacco Use  . Smoking status: Former Smoker    Packs/day: 0.50    Types: Cigarettes  . Smokeless tobacco: Former Neurosurgeon    Types: Snuff  Substance Use Topics  . Alcohol use: No  . Drug use: No     Allergies   Hydrocodone   Review of Systems Review of Systems    Constitutional:       Per HPI, otherwise negative  HENT:       Per HPI, otherwise negative  Respiratory:       Per HPI, otherwise negative  Cardiovascular:       Per HPI, otherwise negative  Gastrointestinal: Negative for vomiting.  Endocrine:       Negative aside from HPI  Genitourinary:       Neg aside from HPI   Musculoskeletal:       Per HPI, otherwise negative  Skin: Negative.   Neurological: Negative for syncope.  Psychiatric/Behavioral: The patient is nervous/anxious.      Physical Exam Updated Vital Signs BP (!) 129/96   Pulse (!) 108   Temp 98.9 F (37.2 C) (Oral)   Resp 19   Ht 5'  9" (1.753 m)   Wt 120.2 kg   SpO2 97%   BMI 39.13 kg/m   Physical Exam  Constitutional: He is oriented to person, place, and time. He appears well-developed. No distress.  HENT:  Head: Normocephalic and atraumatic.  Eyes: Conjunctivae and EOM are normal.  Cardiovascular: Normal rate and regular rhythm.  Pulmonary/Chest: Effort normal. No stridor. No respiratory distress.  Abdominal: He exhibits no distension.  Musculoskeletal: He exhibits no edema.  Neurological: He is alert and oriented to person, place, and time.  Skin: Skin is warm and dry.  Psychiatric: He has a normal mood and affect.  Nursing note and vitals reviewed.    ED Treatments / Results  Labs (all labs ordered are listed, but only abnormal results are displayed) Labs Reviewed  BASIC METABOLIC PANEL - Abnormal; Notable for the following components:      Result Value   Potassium 3.4 (*)    Glucose, Bld 129 (*)    BUN 5 (*)    All other components within normal limits  CBC  I-STAT TROPONIN, ED  POCT I-STAT TROPONIN I  I-STAT TROPONIN, ED  POCT I-STAT TROPONIN I    EKG EKG Interpretation  Date/Time:  Saturday June 03 2018 19:52:46 EDT Ventricular Rate:  116 PR Interval:  174 QRS Duration: 84 QT Interval:  320 QTC Calculation: 444 R Axis:   63 Text Interpretation:  Sinus tachycardia Abnormal  ekg Confirmed by Gerhard Munch [phone]) on 06/03/2018 8:32:27 PM   Radiology Dg Chest 2 View  Result Date: 06/03/2018 CLINICAL DATA:  Chest pain EXAM: CHEST - 2 VIEW COMPARISON:  None. FINDINGS: There is a 1.2 x 0.5 cm nodular appearing opacity in the right upper lobe. The lungs elsewhere are clear. Heart size and pulmonary vascularity are normal. No adenopathy. There is postoperative change in lower cervical region. IMPRESSION: Ovoid nodular opacity right upper lobe, noncalcified. Advise noncontrast enhanced chest CT to further evaluate. Lungs elsewhere clear. No evident adenopathy. Electronically Signed   By: Bretta Bang III M.D.   On: 06/03/2018 20:23   Ct Chest Wo Contrast  Result Date: 06/03/2018 CLINICAL DATA:  Chest pain. EXAM: CT CHEST WITHOUT CONTRAST TECHNIQUE: Multidetector CT imaging of the chest was performed following the standard protocol without IV contrast. COMPARISON:  Chest radiograph 06/03/2018 FINDINGS: Cardiovascular: No significant vascular findings. Normal heart size. No pericardial effusion. Mediastinum/Nodes: No enlarged mediastinal or axillary lymph nodes. Thyroid gland, trachea, and esophagus demonstrate no significant findings. Lungs/Pleura: Lungs are clear. No pleural effusion or pneumothorax. Upper Abdomen: No acute abnormality. Musculoskeletal: No chest wall mass or suspicious bone lesions identified. IMPRESSION: Normal CT examination of the chest. Electronically Signed   By: Ted Mcalpine M.D.   On: 06/03/2018 21:05    Procedures Procedures (including critical care time)  Medications Ordered in ED Medications  aspirin chewable tablet 324 mg (324 mg Oral Given 06/03/18 2140)  fentaNYL (SUBLIMAZE) injection 12.5 mcg (12.5 mcg Intravenous Given 06/03/18 2146)     Initial Impression / Assessment and Plan / ED Course  I have reviewed the triage vital signs and the nursing notes.  Pertinent labs & imaging results that were available during my care of the  patient were reviewed by me and considered in my medical decision making (see chart for details).    No distress on repeat exam pain has resolved, has not returned 11:31 PM Patient remains chest pain-free, no sitting upright in a chair, while his wife is resting in the gurney. No recurrence of  chest pain, he remains hemodynamically unremarkable. He notes that he has baseline tachycardia.  This patient with multiple medical issues presents after an episode of chest pain that began after stressful family situation. Patient is to normal troponin, nonischemic EKG, has no recurrence of his pain, no evidence for ACS, otherwise reassuring findings physical exam, x-ray, labs. Patient discharged in stable condition to follow-up with primary care.  Final Clinical Impressions(s) / ED Diagnoses  Atypical chest pain   Gerhard MunchLockwood, Alakai Macbride, MD 06/03/18 2332

## 2018-06-03 NOTE — ED Triage Notes (Signed)
Pt presents to ED from home for chest pain. Pt reports L side chest pain x1 hour. Pt reports hx of tachycardia and anxiety. Pt reports recent family stress.

## 2018-06-03 NOTE — ED Notes (Signed)
Urine sample at bedside if needed  

## 2018-06-06 ENCOUNTER — Encounter (HOSPITAL_COMMUNITY): Payer: Self-pay

## 2018-06-14 ENCOUNTER — Encounter (HOSPITAL_COMMUNITY): Payer: Self-pay | Admitting: Psychiatry

## 2018-06-14 ENCOUNTER — Encounter (HOSPITAL_COMMUNITY): Payer: Self-pay

## 2018-06-14 ENCOUNTER — Ambulatory Visit (INDEPENDENT_AMBULATORY_CARE_PROVIDER_SITE_OTHER): Payer: Medicare Other | Admitting: Psychiatry

## 2018-06-14 DIAGNOSIS — G4700 Insomnia, unspecified: Secondary | ICD-10-CM

## 2018-06-14 DIAGNOSIS — F4312 Post-traumatic stress disorder, chronic: Secondary | ICD-10-CM | POA: Diagnosis not present

## 2018-06-14 DIAGNOSIS — F319 Bipolar disorder, unspecified: Secondary | ICD-10-CM

## 2018-06-14 DIAGNOSIS — Z79899 Other long term (current) drug therapy: Secondary | ICD-10-CM

## 2018-06-14 DIAGNOSIS — F411 Generalized anxiety disorder: Secondary | ICD-10-CM

## 2018-06-14 DIAGNOSIS — Z811 Family history of alcohol abuse and dependence: Secondary | ICD-10-CM

## 2018-06-14 DIAGNOSIS — Z87891 Personal history of nicotine dependence: Secondary | ICD-10-CM

## 2018-06-14 DIAGNOSIS — Z736 Limitation of activities due to disability: Secondary | ICD-10-CM

## 2018-06-14 MED ORDER — ESCITALOPRAM OXALATE 20 MG PO TABS: 20.0000 mg | ORAL_TABLET | Freq: Every day | ORAL | 2 refills | Status: AC

## 2018-06-14 MED ORDER — CLONAZEPAM 1 MG PO TABS: 1.0000 mg | ORAL_TABLET | Freq: Three times a day (TID) | ORAL | 2 refills | Status: AC | PRN

## 2018-06-14 MED ORDER — PRAZOSIN HCL 5 MG PO CAPS: 5.0000 mg | ORAL_CAPSULE | Freq: Every day | ORAL | 2 refills | Status: AC

## 2018-06-14 MED ORDER — AMITRIPTYLINE HCL 150 MG PO TABS: 150.0000 mg | ORAL_TABLET | Freq: Every day | ORAL | 2 refills | Status: AC
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
## 2018-06-15 ENCOUNTER — Encounter

## 2018-06-20 ENCOUNTER — Ambulatory Visit (HOSPITAL_COMMUNITY): Payer: Self-pay | Admitting: Psychiatry

## 2018-06-29 ENCOUNTER — Encounter: Payer: Self-pay | Admitting: Adult Health

## 2018-06-30 ENCOUNTER — Encounter: Payer: Self-pay | Admitting: Adult Health

## 2018-06-30 DIAGNOSIS — Z760 Encounter for issue of repeat prescription: Secondary | ICD-10-CM

## 2018-06-30 DIAGNOSIS — E785 Hyperlipidemia, unspecified: Secondary | ICD-10-CM

## 2018-06-30 MED ORDER — PRAVASTATIN SODIUM 20 MG PO TABS: 20.0000 mg | ORAL_TABLET | Freq: Every day | ORAL | 1 refills | Status: AC

## 2018-07-13 ENCOUNTER — Telehealth (HOSPITAL_COMMUNITY): Payer: Self-pay | Admitting: *Deleted

## 2018-07-13 NOTE — Telephone Encounter (Signed)
Patient called stating he needed his letter for his emotional support animal to be addressed as: "To Whom It May Concern," this writer fixed the letter and has notified patient of correction. Patient states his wife will pick it up at the office next week.

## 2018-08-31 ENCOUNTER — Ambulatory Visit (HOSPITAL_COMMUNITY): Payer: Self-pay | Admitting: Psychiatry

## 2018-12-23 IMAGING — MR MR KNEE*R* W/O CM
6 series · 34 of 40 positions shown · non-contrast
Comparison: Radiographs 08/16/2017

CLINICAL DATA: Bilateral knee pain for several years. No specific
injury.

EXAM:
MRI OF THE RIGHT KNEE WITHOUT CONTRAST
TECHNIQUE: Multiplanar, multisequence MR imaging of the knee was performed. No
intravenous contrast was administered.

[Series 6: PD fat-sat · axial · right · 3.0mm · 0.39mm/px · z∈[-65,+50]mm · 8 of 36 slices shown (1 of 3)]
[im 1/36]
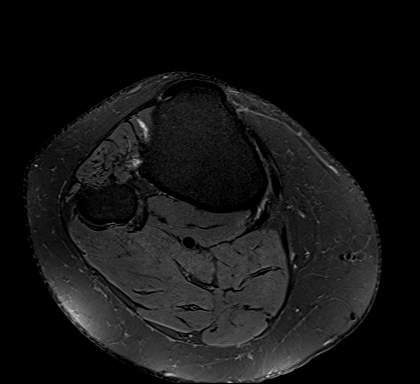
[im 6/36]
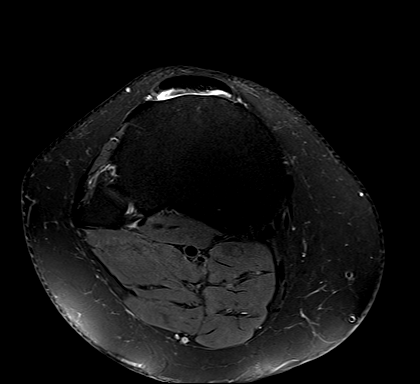
[im 11/36]
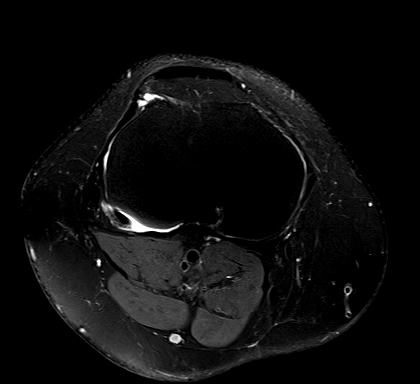
[im 16/36]
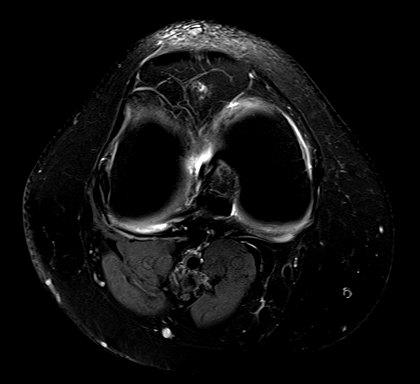
[im 21/36]
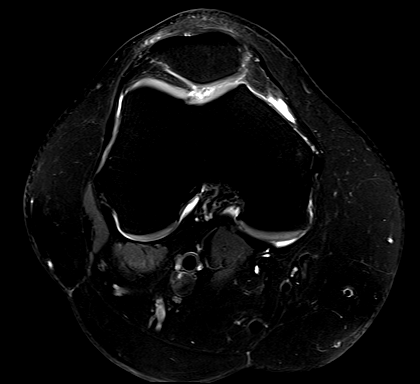
[im 26/36]
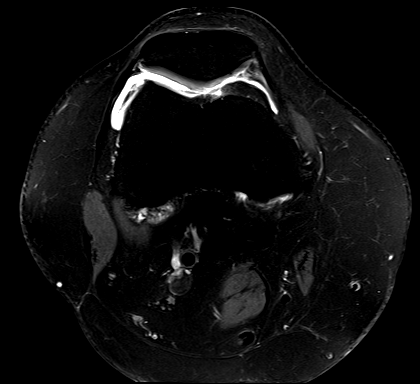
[im 31/36]
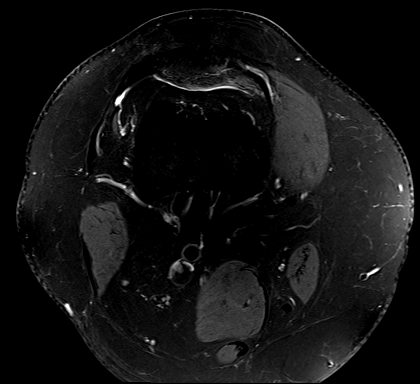
[im 36/36]
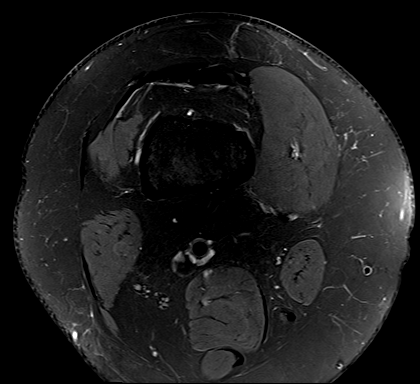

[Series 7: T1 · coronal · right · 3.0mm · 0.39mm/px · 1 of 34 slices shown]
[im 1/34]
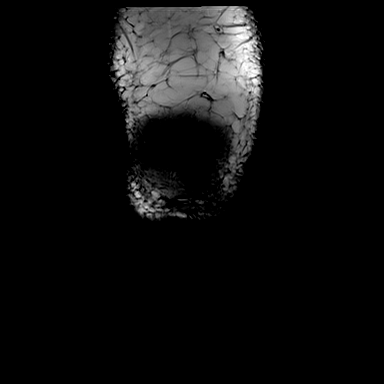

[Series 8: PD fat-sat · coronal · right · 3.0mm · 0.39mm/px · 7 of 34 slices shown (2 of 3)]
[im 1/34]
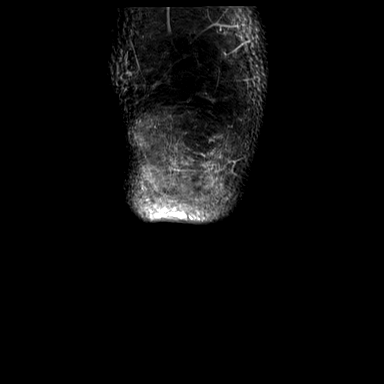
[im 6/34]
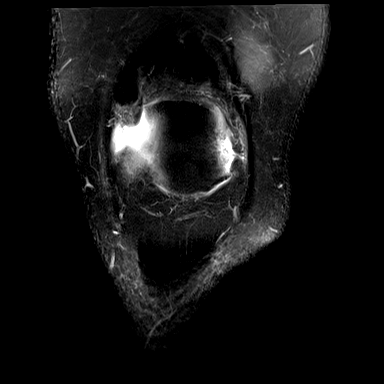
[im 12/34]
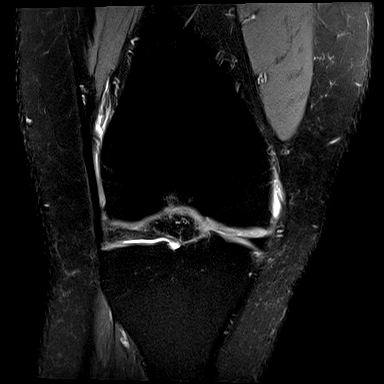
[im 17/34]
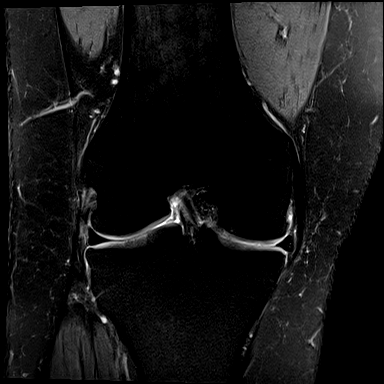
[im 23/34]
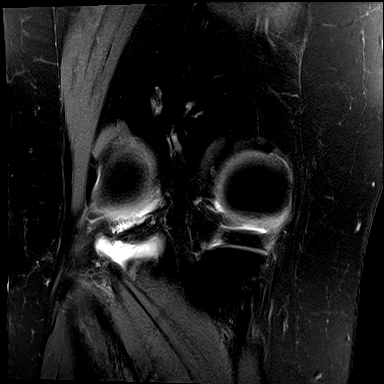
[im 28/34]
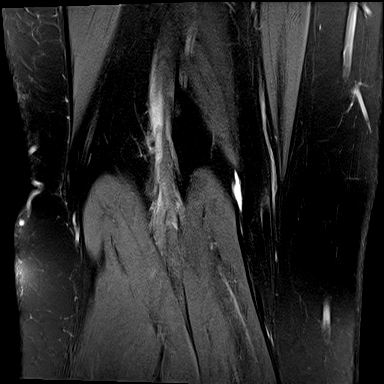
[im 34/34]
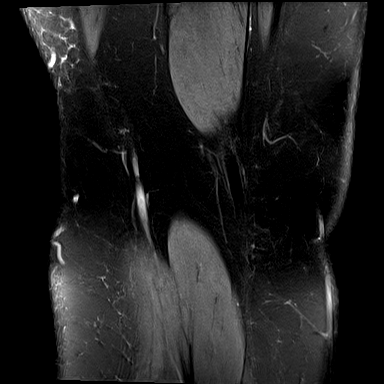

[Series 9: PD fat-sat · sagittal · right · 3.0mm · 0.39mm/px · 7 of 31 slices shown (3 of 3)]
[im 1/31]
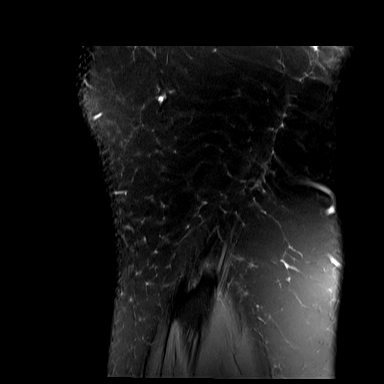
[im 6/31]
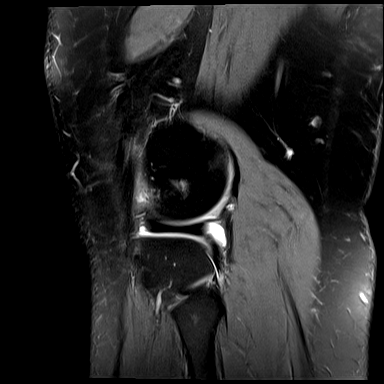
[im 11/31]
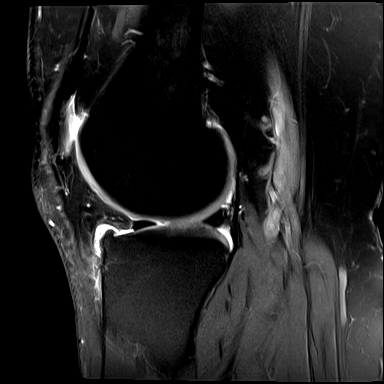
[im 16/31]
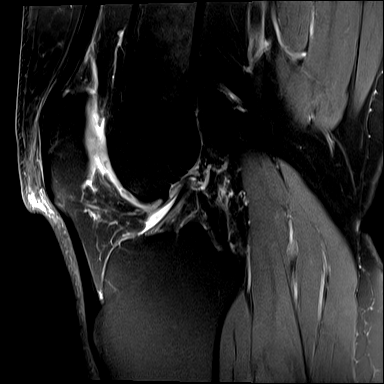
[im 21/31]
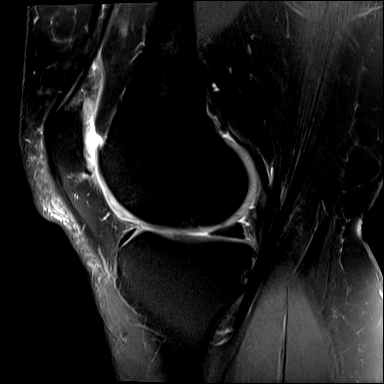
[im 26/31]
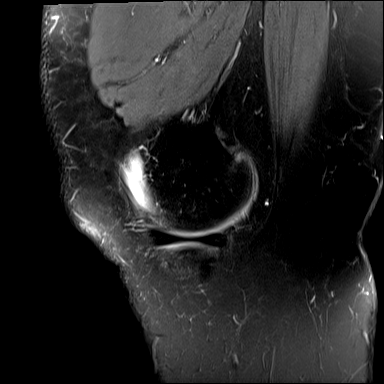
[im 31/31]
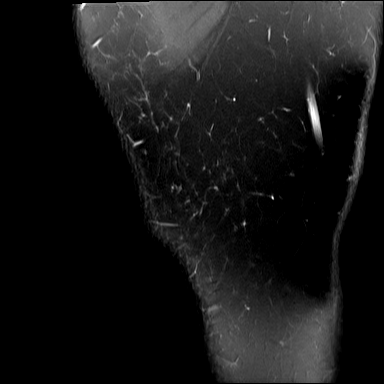

[Series 10: T2 fat-sat · coronal · right · 3.0mm · 0.39mm/px · 7 of 34 slices shown]
[im 1/34]
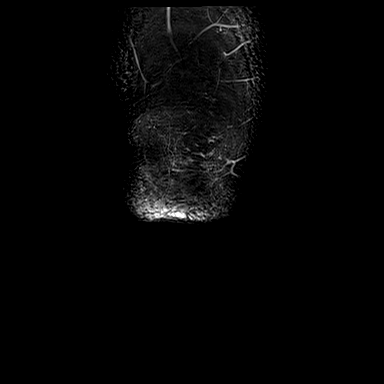
[im 6/34]
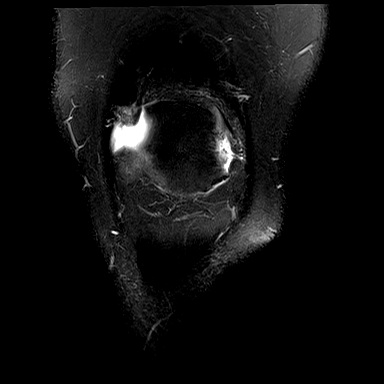
[im 12/34]
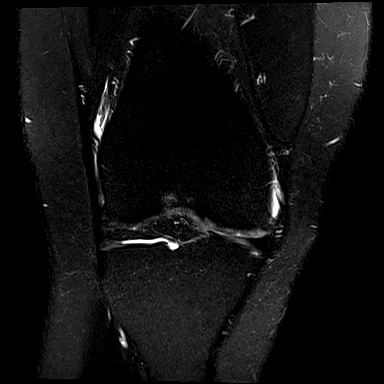
[im 17/34]
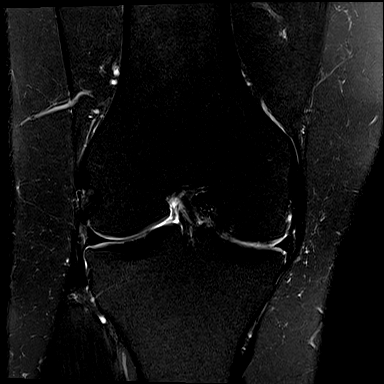
[im 23/34]
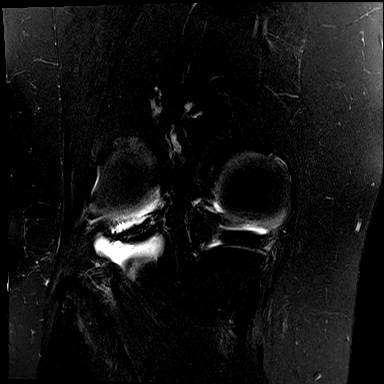
[im 28/34]
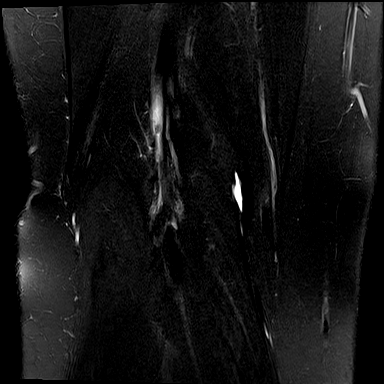
[im 34/34]
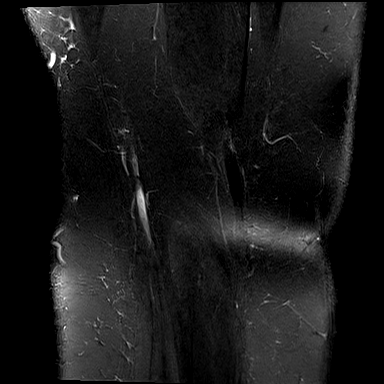

[Series 11: PD · coronal · right · 1.5mm · 0.44mm/px · 4 of 21 slices shown]
[im 1/21]
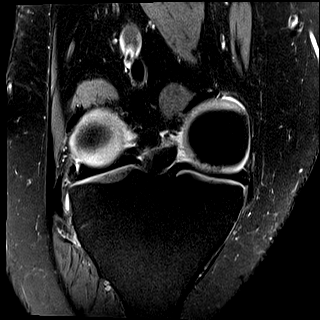
[im 7/21]
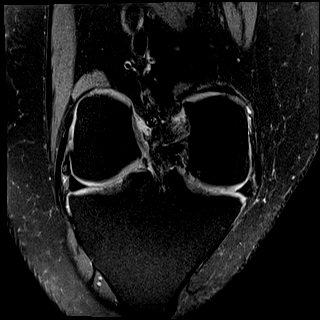
[im 14/21]
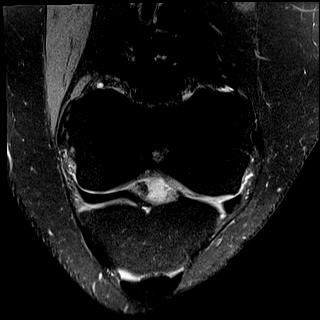
[im 21/21]
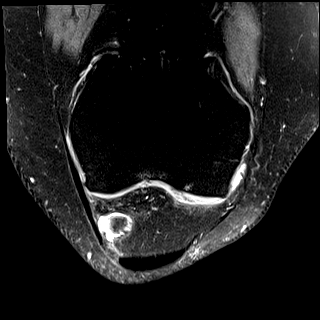

[34 of 40 positions shown; findings below may reference images not displayed]

FINDINGS: MENISCI

Medial meniscus:  Intact

Lateral meniscus:  Intact

LIGAMENTS

Cruciates: Intact. Mild mucoid degeneration of the ACL and to a
lesser extent the PCL.

Collaterals:  Intact

CARTILAGE

Patellofemoral: The moderate degenerative chondrosis mainly along
the medial facet and patellar apex with chondral fissuring and
cartilage thinning. No full-thickness cartilage defect.

Medial: Mild to moderate degenerative chondrosis mainly along the
lateral aspect of the medial femoral condyles.

Lateral: Minimal degenerative chondrosis involving the tibial
articular cartilage.

Joint: Small joint effusion. Mild thickening and edema of the
quadriceps fat pad could cause symptoms of anterior impingement.

Popliteal Fossa: Very small Baker's cyst. Some fluid tracking back
along the popliteus tendon.

Extensor Mechanism: The patella retinacular structures are intact
and the quadriceps and patellar tendons are intact. Mild proximal
patellar tendinopathy.

Bones:  No acute bony findings.

Other: Normal knee musculature.
IMPRESSION: 1. Intact ligamentous structures and no acute bony findings.
2. No meniscal tears.
3. Patellofemoral degenerative chondrosis.
4. Mild thickening and edema of the quadriceps fat pad.
5. Small joint effusion and very small Baker's cyst.
6. Mild proximal patellar tendinopathy.

## 2019-01-14 IMAGING — DX DG KNEE 1-2V PORT*R*
2 series · 2 of 2 positions shown · non-contrast
Comparison: None.

CLINICAL DATA: Status post patellofemoral arthroplasty.

EXAM:
PORTABLE RIGHT KNEE - 1-2 VIEW

[knee ap]
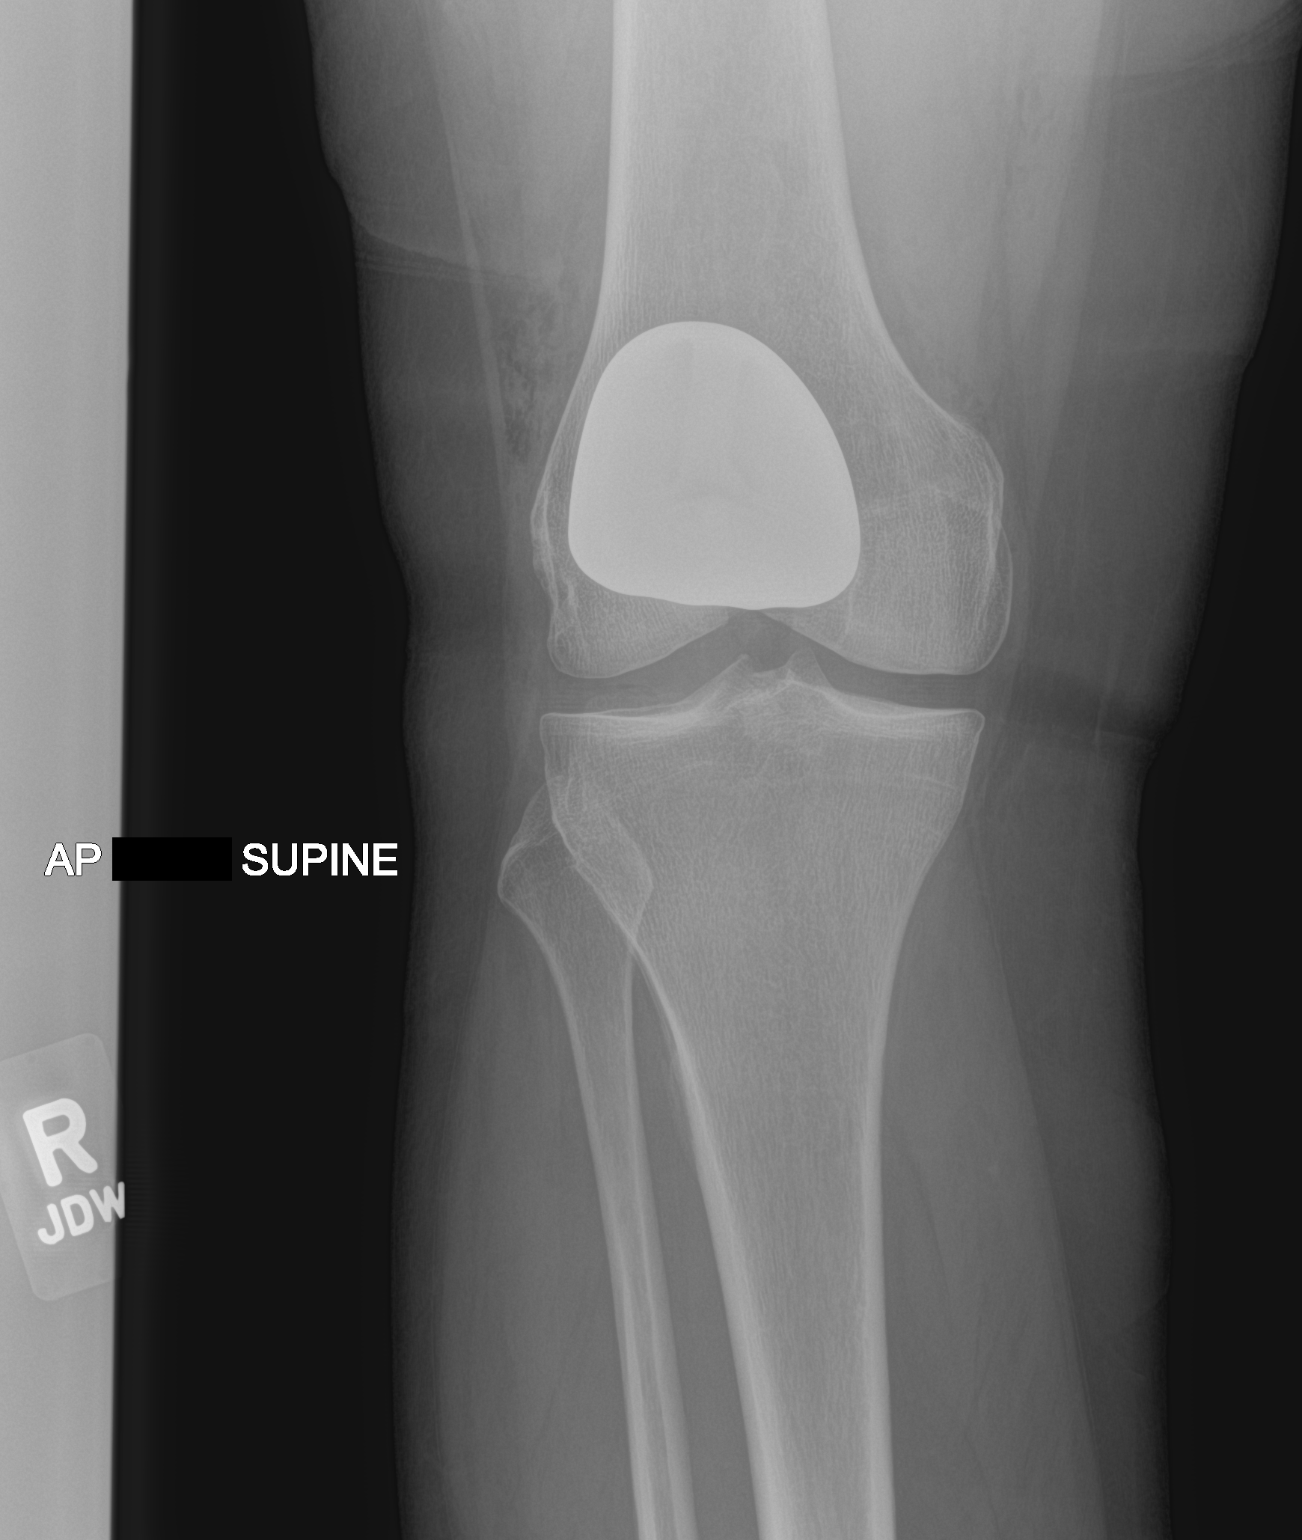

[knee obl]
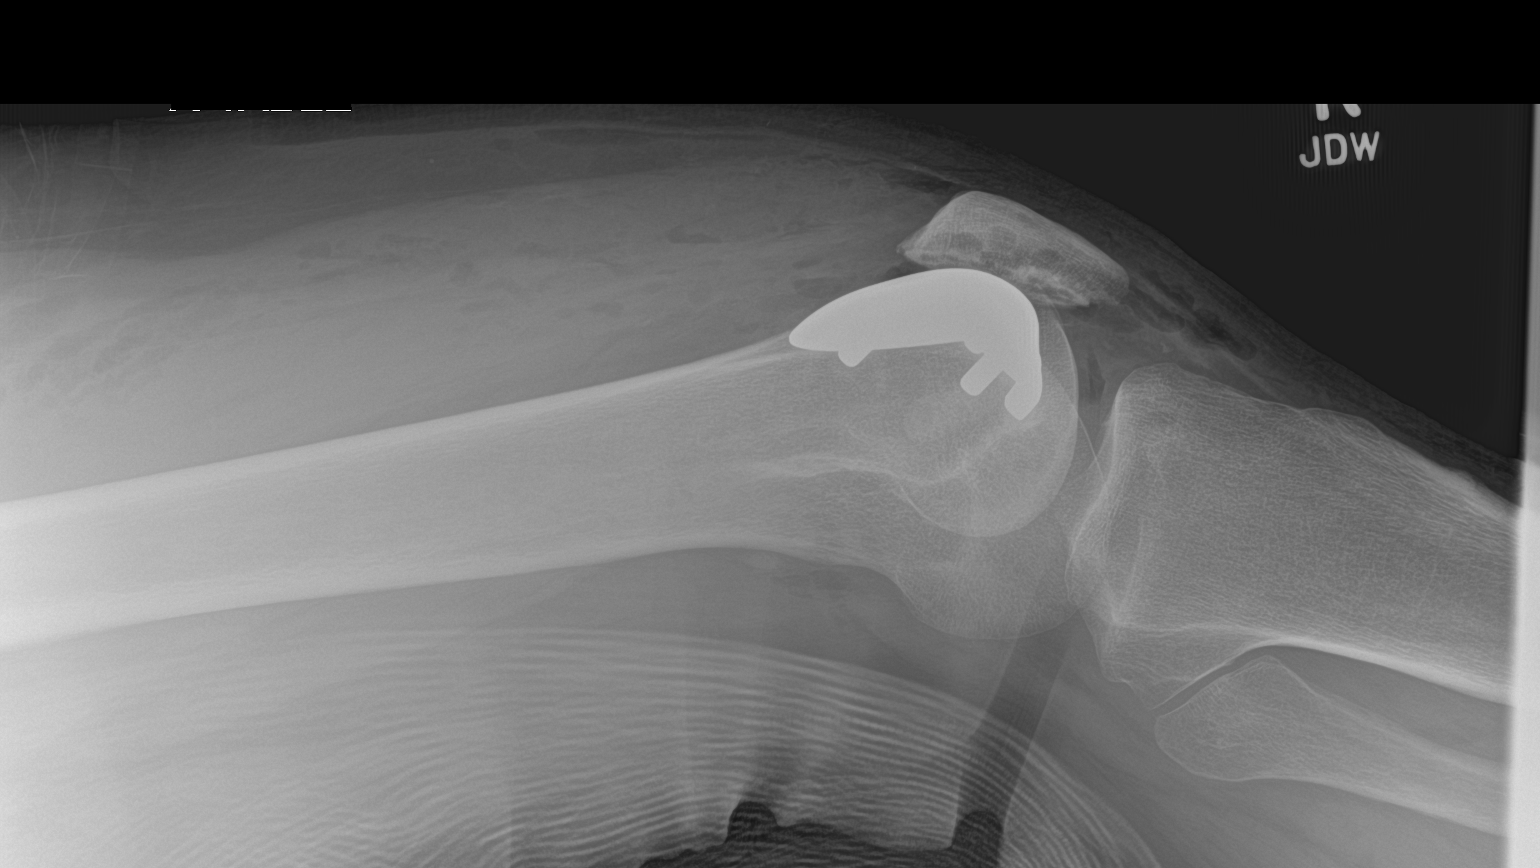

[2 of 2 positions shown; findings below may reference images not displayed]

FINDINGS: Radiographic appearance status post patellofemoral arthroplasty with
resurfaced patella and femoral trochlea component showing normal
alignment. Expected soft tissue air present postoperatively as well
as soft tissue swelling anterior to the distal femur. No fracture or
abnormal lucency surrounding the trochlea component.
IMPRESSION: Normal alignment following right patellofemoral arthroplasty.

## 2020-11-10 ENCOUNTER — Telehealth: Payer: Self-pay | Admitting: Adult Health

## 2020-11-10 NOTE — Telephone Encounter (Signed)
Tried calling patient to  schedule Medicare Annual Wellness Visit (AWV) either virtually or in office.  No answer   Last AWV no information  please schedule at anytime with [facility] Nurse Health Advisor 1 or 2   This should be a 45 minute visit. Patient needs appointment with PCP last office visit 12/30/17

## 2020-12-19 ENCOUNTER — Telehealth: Payer: Self-pay | Admitting: Adult Health

## 2020-12-19 NOTE — Telephone Encounter (Signed)
Tried calling patient to  schedule Medicare Annual Wellness Visit (AWV) either virtually or in office.  No answre    AWVI  please schedule at anytime with [facility] Nurse Health Advisor 1 or 2   This should be a 45 minute visit.
